# Patient Record
Sex: Female | Born: 1950 | Race: Black or African American | Hispanic: No | Smoking: Current every day smoker
Health system: Southern US, Community
[De-identification: ages and names within clinical notes are randomized; demographics above are authoritative.]

## PROBLEM LIST (undated history)

## (undated) DIAGNOSIS — E119 Type 2 diabetes mellitus without complications: Secondary | ICD-10-CM

## (undated) DIAGNOSIS — C801 Malignant (primary) neoplasm, unspecified: Secondary | ICD-10-CM

## (undated) DIAGNOSIS — I10 Essential (primary) hypertension: Secondary | ICD-10-CM

## (undated) DIAGNOSIS — I739 Peripheral vascular disease, unspecified: Secondary | ICD-10-CM

## (undated) DIAGNOSIS — E785 Hyperlipidemia, unspecified: Secondary | ICD-10-CM

## (undated) HISTORY — DX: Malignant (primary) neoplasm, unspecified: C80.1

## (undated) HISTORY — DX: Type 2 diabetes mellitus without complications: E11.9

## (undated) HISTORY — DX: Hyperlipidemia, unspecified: E78.5

## (undated) HISTORY — DX: Essential (primary) hypertension: I10

## (undated) HISTORY — PX: ABDOMINAL HYSTERECTOMY: SHX81

## (undated) HISTORY — PX: OTHER SURGICAL HISTORY: SHX169

---

## 2015-08-14 ENCOUNTER — Ambulatory Visit (INDEPENDENT_AMBULATORY_CARE_PROVIDER_SITE_OTHER): Payer: Medicare Other | Admitting: Vascular Surgery

## 2015-08-14 ENCOUNTER — Other Ambulatory Visit: Payer: Self-pay

## 2015-08-14 ENCOUNTER — Encounter: Payer: Self-pay | Admitting: Vascular Surgery

## 2015-08-14 VITALS — BP 133/47 | HR 80 | Temp 98.1°F | Resp 16 | Ht 61.0 in | Wt 197.0 lb

## 2015-08-14 DIAGNOSIS — I70269 Atherosclerosis of native arteries of extremities with gangrene, unspecified extremity: Secondary | ICD-10-CM | POA: Insufficient documentation

## 2015-08-14 DIAGNOSIS — I70261 Atherosclerosis of native arteries of extremities with gangrene, right leg: Secondary | ICD-10-CM

## 2015-08-14 NOTE — Progress Notes (Signed)
Referred by:  Traci Raymond, [place] [address]  Reason for referral: Right 2nd toe distal phalange ulcer  History of Present Illness  Traci Raymond is a 65 y.o. (22-Jul-1951) female history of stents in both legs who presents with chief complaint: pain in right leg.  Onset of short distance intermittent claudication for years, with worsening of right 2nd toe wound 3 weeks ago.  The patient is being seen by wound care.  With debridement of the ulcer overlying the right second toe, bone became exposed.  The patient is using a combination of silvadene and Betadine paint to the right 2nd toe at this point.  Pain is described as aching, severity 3-6/10, and associated with walking.  Patient has attempted to treat this pain with wound care.  The patient has no rest pain symptoms.  Atherosclerotic risk factors include: HTN, DM, HLD, and active smoking.  Past Medical History  Diagnosis Date  . Cancer ([place])     Uterian  . Diabetes mellitus without complication ([place])   . Hypertension   . Hyperlipidemia     Controlled by Rx    Past Surgical History  Procedure Laterality Date  . Abdominal hysterectomy    . Stents Bilateral     LE Right Leg one stent and Left has 2 stents    Social History   Social History  . Marital Status: Widowed    Spouse Name: N/A  . Number of Children: N/A  . Years of Education: N/A   Occupational History  . Not on file.   Social History Main Topics  . Smoking status: Current Every Day Smoker  . Smokeless tobacco: Never Used  . Alcohol Use: No  . Drug Use: Yes  . Sexual Activity: Not on file   Other Topics Concern  . Not on file   Social History Narrative  . No narrative on file    Family History  Problem Relation Age of Onset  . Diabetes Mother   . Hypertension Mother   . Hyperlipidemia Mother   . Diabetes Father   . Hyperlipidemia Father   . Hypertension Father     Current Outpatient Prescriptions  Medication Sig Dispense  Refill  . clindamycin (CLEOCIN) 300 MG capsule     . clopidogrel (PLAVIX) 75 MG tablet     . fluconazole (DIFLUCAN) 200 MG tablet     . GLOBAL EASE INJECT PEN NEEDLES 29G X 12MM MISC     . LEVEMIR FLEXTOUCH 100 UNIT/ML Pen     . lisinopril (PRINIVIL,ZESTRIL) 10 MG tablet     . lisinopril (PRINIVIL,ZESTRIL) 5 MG tablet     . metFORMIN (GLUCOPHAGE) 1000 MG tablet     . NOVOLOG FLEXPEN 100 UNIT/ML FlexPen     . potassium chloride (K-DUR) 10 MEQ tablet     . rosuvastatin (CRESTOR) 40 MG tablet     . silver sulfADIAZINE (SILVADENE) 1 % cream      No current facility-administered medications for this visit.    Not on File   REVIEW OF SYSTEMS:  (Positives checked otherwise negative)  CARDIOVASCULAR:   [ ]  chest pain,  [ ]  chest pressure,  [ ]  palpitations,  [ ]  shortness of breath when laying flat,  [ ]  shortness of breath with exertion,   [x]  pain in feet when walking,  [ ]  pain in feet when laying flat, [ ]  history of blood clot in veins (DVT),  [ ]  history of phlebitis,  [ ]  swelling in  legs,  [ ]  varicose veins  PULMONARY:   [ ]  productive cough,  [ ]  asthma,  [ ]  wheezing  NEUROLOGIC:   [ ]  weakness in arms or legs,  [ ]  numbness in arms or legs,  [ ]  difficulty speaking or slurred speech,  [ ]  temporary loss of vision in one eye,  [ ]  dizziness  HEMATOLOGIC:   [ ]  bleeding problems,  [ ]  problems with blood clotting too easily  MUSCULOSKEL:   [ ]  joint pain, [ ]  joint swelling  GASTROINTEST:   [ ]  vomiting blood,  [ ]  blood in stool     GENITOURINARY:   [ ]  burning with urination,  [ ]  blood in urine  PSYCHIATRIC:   [ ]  history of major depression  INTEGUMENTARY:   [ ]  rashes,  [x]  ulcers  CONSTITUTIONAL:   [ ]  fever,  [ ]  chills   For VQI Use Only  PRE-ADM LIVING: Home  AMB STATUS: Ambulatory  CAD Sx: None  PRIOR CHF: None  STRESS TEST: [x]  No, [ ]  Normal, [ ]  + ischemia, [ ]  + MI, [ ]  Both   Physical Examination  Filed Vitals:    08/14/15 1004 08/14/15 1014  BP: 153/46 133/47  Pulse: 79 80  Temp: 98.1 F (36.7 C)   TempSrc: Oral   Resp: 16   Height: 5\' 1"  (1.549 m)   Weight: 197 lb (89.359 kg)   SpO2: 97%    Body mass index is 37.24 kg/(m^2).  General: A&O x 3, WD, obese  Head: [place]/AT  Ear/Nose/Throat: Hearing grossly intact, nares w/o erythema or drainage, oropharynx w/o Erythema/Exudate, Mallampati score: 3  Eyes: PERRLA, EOMI  Neck: Supple, no nuchal rigidity, no palpable LAD  Pulmonary: Sym exp, good air movt, CTAB, no rales, rhonchi, & wheezing  Cardiac: RRR, Nl S1, S2, no Murmurs, rubs or gallops  Vascular: Vessel Right Left  Radial Palpable Palpable  Brachial Palpable Palpable  Carotid Palpable, without bruit Palpable, without bruit  Aorta Not palpable N/A  Femoral Palpable Palpable  Popliteal Not palpable Not palpable  PT Not Palpable Not Palpable  DP Not Palpable Not Palpable   Gastrointestinal: soft, NTND, -G/R, - HSM, - masses, - CVAT B  Musculoskeletal: M/S 5/5 throughout including full DF/PF in both feet, Extremities without ischemic changes except Right 2nd toe ischemic with exposed distal phalange bone  Neurologic: CN 2-12 intact , Pain and light touch intact in extremities including R foot, Motor exam as listed above  Psychiatric: Judgment intact, Mood & affect appropriate for pt's clinical situation  Dermatologic: See M/S exam for extremity exam, no rashes otherwise noted  Lymph : No Cervical, Axillary, or Inguinal lymphadenopathy    Non-Invasive Vascular Imaging  Outside physiologic BLE (Date: 08/13/15)  R: 0.49  L: 0.66  Outside Studies/Documentation 5 pages of outside documents were reviewed including: outside wound care chart and outside physiologic study.   Medical Decision Making  Traci Raymond is a 65 y.o. female who presents with: RLE critical limb ischemia, LLE intermittent claudication    I discussed with the patient the natural history of  critical limb ischemia: 25% require amputation in one year, 50% are able to maintain their limbs in one year, and 25-30% die in one year due to comorbidities.  Given the limb threatening status of this patient, I recommend an aggressive work up including proceeding with an: Aortogram, Bilateral runoff and possible intervention right leg I discussed with the patient the nature of angiographic procedures,  especially the limited patencies of any endovascular intervention. The patient is aware of that the risks of an angiographic procedure include but are not limited to: bleeding, infection, access site complications, embolization, rupture of treated vessel, dissection, possible need for emergent surgical intervention, and possible need for surgical procedures to treat the patient's pathology. The patient is aware of the risks and agrees to proceed.  The procedure is scheduled for: 9 JAN 17.  I discussed in depth with the patient the nature of atherosclerosis, and emphasized the importance of maximal medical management including strict control of blood pressure, blood glucose, and lipid levels, antiplatelet agents, obtaining regular exercise, and cessation of smoking.  The patient is aware that without maximal medical management the underlying atherosclerotic disease process will progress, limiting the benefit of any interventions. The patient is currently on a statin:  Crestor. The patient is currently on an anti-platelet: Plavix.  Thank you for allowing [place] to participate in this patient's care.   Adele Barthel, MD [facility] Office: [phone] Pager: [phone]  08/14/2015, 10:36 AM

## 2015-08-27 ENCOUNTER — Other Ambulatory Visit: Payer: Self-pay | Admitting: *Deleted

## 2015-08-27 ENCOUNTER — Ambulatory Visit (HOSPITAL_COMMUNITY)
Admission: RE | Admit: 2015-08-27 | Discharge: 2015-08-27 | Disposition: A | Discharge status: Home / Self Care | Payer: Medicare Other | Source: Ambulatory Visit | Attending: Vascular Surgery | Admitting: Vascular Surgery

## 2015-08-27 ENCOUNTER — Encounter (HOSPITAL_COMMUNITY)
Admission: RE | Disposition: A | Discharge status: Home / Self Care | Payer: Self-pay | Source: Ambulatory Visit | Attending: Vascular Surgery

## 2015-08-27 DIAGNOSIS — Z7902 Long term (current) use of antithrombotics/antiplatelets: Secondary | ICD-10-CM | POA: Insufficient documentation

## 2015-08-27 DIAGNOSIS — I70335 Atherosclerosis of unspecified type of bypass graft(s) of the right leg with ulceration of other part of foot: Secondary | ICD-10-CM | POA: Diagnosis not present

## 2015-08-27 DIAGNOSIS — F17210 Nicotine dependence, cigarettes, uncomplicated: Secondary | ICD-10-CM | POA: Diagnosis not present

## 2015-08-27 DIAGNOSIS — E785 Hyperlipidemia, unspecified: Secondary | ICD-10-CM | POA: Diagnosis not present

## 2015-08-27 DIAGNOSIS — Z6837 Body mass index (BMI) 37.0-37.9, adult: Secondary | ICD-10-CM | POA: Insufficient documentation

## 2015-08-27 DIAGNOSIS — Z794 Long term (current) use of insulin: Secondary | ICD-10-CM | POA: Insufficient documentation

## 2015-08-27 DIAGNOSIS — E1151 Type 2 diabetes mellitus with diabetic peripheral angiopathy without gangrene: Secondary | ICD-10-CM | POA: Diagnosis not present

## 2015-08-27 DIAGNOSIS — L97529 Non-pressure chronic ulcer of other part of left foot with unspecified severity: Secondary | ICD-10-CM | POA: Diagnosis not present

## 2015-08-27 DIAGNOSIS — E669 Obesity, unspecified: Secondary | ICD-10-CM | POA: Insufficient documentation

## 2015-08-27 DIAGNOSIS — Z8249 Family history of ischemic heart disease and other diseases of the circulatory system: Secondary | ICD-10-CM | POA: Diagnosis not present

## 2015-08-27 DIAGNOSIS — Z7984 Long term (current) use of oral hypoglycemic drugs: Secondary | ICD-10-CM | POA: Insufficient documentation

## 2015-08-27 DIAGNOSIS — I70221 Atherosclerosis of native arteries of extremities with rest pain, right leg: Secondary | ICD-10-CM | POA: Diagnosis not present

## 2015-08-27 DIAGNOSIS — I70261 Atherosclerosis of native arteries of extremities with gangrene, right leg: Secondary | ICD-10-CM | POA: Insufficient documentation

## 2015-08-27 DIAGNOSIS — Z95820 Peripheral vascular angioplasty status with implants and grafts: Secondary | ICD-10-CM | POA: Diagnosis not present

## 2015-08-27 DIAGNOSIS — I10 Essential (primary) hypertension: Secondary | ICD-10-CM | POA: Insufficient documentation

## 2015-08-27 DIAGNOSIS — Z01810 Encounter for preprocedural cardiovascular examination: Secondary | ICD-10-CM

## 2015-08-27 DIAGNOSIS — I7025 Atherosclerosis of native arteries of other extremities with ulceration: Secondary | ICD-10-CM | POA: Diagnosis present

## 2015-08-27 DIAGNOSIS — I739 Peripheral vascular disease, unspecified: Secondary | ICD-10-CM

## 2015-08-27 DIAGNOSIS — I70269 Atherosclerosis of native arteries of extremities with gangrene, unspecified extremity: Secondary | ICD-10-CM | POA: Diagnosis present

## 2015-08-27 HISTORY — PX: PERIPHERAL VASCULAR CATHETERIZATION: SHX172C

## 2015-08-27 LAB — POCT I-STAT, CHEM 8
BUN: 10 mg/dL (ref 6–20)
CALCIUM ION: 1.16 mmol/L (ref 1.13–1.30)
Chloride: 103 mmol/L (ref 101–111)
Creatinine, Ser: 0.9 mg/dL (ref 0.44–1.00)
Glucose, Bld: 245 mg/dL — ABNORMAL HIGH (ref 65–99)
HCT: 40 % (ref 36.0–46.0)
HEMOGLOBIN: 13.6 g/dL (ref 12.0–15.0)
Potassium: 3.5 mmol/L (ref 3.5–5.1)
SODIUM: 141 mmol/L (ref 135–145)
TCO2: 25 mmol/L (ref 0–100)

## 2015-08-27 LAB — GLUCOSE, CAPILLARY
Glucose-Capillary: 217 mg/dL — ABNORMAL HIGH (ref 65–99)
Glucose-Capillary: 204 mg/dL — ABNORMAL HIGH (ref 65–99)

## 2015-08-27 SURGERY — ABDOMINAL AORTOGRAM
Anesthesia: LOCAL

## 2015-08-27 MED ORDER — OXYCODONE-ACETAMINOPHEN 5-325 MG PO TABS: 1.0000 | ORAL_TABLET | Freq: Four times a day (QID) | ORAL | Status: DC | PRN

## 2015-08-27 MED ORDER — HYDRALAZINE HCL 20 MG/ML IJ SOLN
INTRAMUSCULAR | Status: AC
  Filled 2015-08-27: qty 1

## 2015-08-27 MED ORDER — FENTANYL CITRATE (PF) 100 MCG/2ML IJ SOLN
INTRAMUSCULAR | Status: AC
  Filled 2015-08-27: qty 2

## 2015-08-27 MED ORDER — LIDOCAINE HCL (PF) 1 % IJ SOLN
INTRAMUSCULAR | Status: DC | PRN
  Administered 2015-08-27: 14 mL

## 2015-08-27 MED ORDER — FENTANYL CITRATE (PF) 100 MCG/2ML IJ SOLN
INTRAMUSCULAR | Status: DC | PRN
  Administered 2015-08-27: 50 ug via INTRAVENOUS

## 2015-08-27 MED ORDER — SODIUM CHLORIDE 0.9 % IV SOLN
INTRAVENOUS | Status: DC
  Administered 2015-08-27: 08:00:00 via INTRAVENOUS

## 2015-08-27 MED ORDER — MIDAZOLAM HCL 2 MG/2ML IJ SOLN
INTRAMUSCULAR | Status: DC | PRN
  Administered 2015-08-27: 1 mg via INTRAVENOUS

## 2015-08-27 MED ORDER — HYDRALAZINE HCL 20 MG/ML IJ SOLN
10.0000 mg | INTRAMUSCULAR | Status: DC | PRN
Start: 2015-08-27 — End: 2015-08-27

## 2015-08-27 MED ORDER — HEPARIN (PORCINE) IN NACL 2-0.9 UNIT/ML-% IJ SOLN
INTRAMUSCULAR | Status: AC
  Filled 2015-08-27: qty 1000

## 2015-08-27 MED ORDER — HYDRALAZINE HCL 20 MG/ML IJ SOLN
INTRAMUSCULAR | Status: DC | PRN
  Administered 2015-08-27 (×2): 10 mg via INTRAVENOUS

## 2015-08-27 MED ORDER — MIDAZOLAM HCL 2 MG/2ML IJ SOLN
INTRAMUSCULAR | Status: AC
  Filled 2015-08-27: qty 2

## 2015-08-27 MED ORDER — SODIUM CHLORIDE 0.9 % IV SOLN: 1.0000 mL/kg/h | INTRAVENOUS | Status: DC

## 2015-08-27 MED ORDER — NITROGLYCERIN 1 MG/10 ML FOR IR/CATH LAB
INTRA_ARTERIAL | Status: AC
  Filled 2015-08-27: qty 10

## 2015-08-27 MED ORDER — LIDOCAINE HCL (PF) 1 % IJ SOLN
INTRAMUSCULAR | Status: AC
  Filled 2015-08-27: qty 30

## 2015-08-27 MED ORDER — MORPHINE SULFATE (PF) 2 MG/ML IV SOLN: 2.0000 mg | INTRAVENOUS | Status: DC | PRN

## 2015-08-27 SURGICAL SUPPLY — 11 items
CATH OMNI FLUSH 5F 65CM (CATHETERS) ×2 IMPLANT
CATH STRAIGHT 5FR 65CM (CATHETERS) ×2 IMPLANT
COVER PRB 48X5XTLSCP FOLD TPE (BAG) ×1 IMPLANT
COVER PROBE 5X48 (BAG) ×1
KIT MICROINTRODUCER STIFF 5F (SHEATH) ×2 IMPLANT
KIT PV (KITS) ×2 IMPLANT
SHEATH PINNACLE 5F 10CM (SHEATH) ×2 IMPLANT
SYR MEDRAD MARK V 150ML (SYRINGE) ×2 IMPLANT
TRANSDUCER W/STOPCOCK (MISCELLANEOUS) ×2 IMPLANT
TRAY PV CATH (CUSTOM PROCEDURE TRAY) ×2 IMPLANT
WIRE BENTSON .035X145CM (WIRE) ×2 IMPLANT

## 2015-08-27 NOTE — H&P (View-Only) (Signed)
Referred by:  Caprice Beaver, [place] [address]  Reason for referral: Right 2nd toe distal phalange ulcer  History of Present Illness  Traci Raymond is a 65 y.o. (1951-02-15) female history of stents in both legs who presents with chief complaint: pain in right leg.  Onset of short distance intermittent claudication for years, with worsening of right 2nd toe wound 3 weeks ago.  The patient is being seen by wound care.  With debridement of the ulcer overlying the right second toe, bone became exposed.  The patient is using a combination of silvadene and Betadine paint to the right 2nd toe at this point.  Pain is described as aching, severity 3-6/10, and associated with walking.  Patient has attempted to treat this pain with wound care.  The patient has no rest pain symptoms.  Atherosclerotic risk factors include: HTN, DM, HLD, and active smoking.  Past Medical History  Diagnosis Date  . Cancer ([place])     Uterian  . Diabetes mellitus without complication ([place])   . Hypertension   . Hyperlipidemia     Controlled by Rx    Past Surgical History  Procedure Laterality Date  . Abdominal hysterectomy    . Stents Bilateral     LE Right Leg one stent and Left has 2 stents    Social History   Social History  . Marital Status: Widowed    Spouse Name: N/A  . Number of Children: N/A  . Years of Education: N/A   Occupational History  . Not on file.   Social History Main Topics  . Smoking status: Current Every Day Smoker  . Smokeless tobacco: Never Used  . Alcohol Use: No  . Drug Use: Yes  . Sexual Activity: Not on file   Other Topics Concern  . Not on file   Social History Narrative  . No narrative on file    Family History  Problem Relation Age of Onset  . Diabetes Mother   . Hypertension Mother   . Hyperlipidemia Mother   . Diabetes Father   . Hyperlipidemia Father   . Hypertension Father     Current Outpatient Prescriptions  Medication Sig Dispense  Refill  . clindamycin (CLEOCIN) 300 MG capsule     . clopidogrel (PLAVIX) 75 MG tablet     . fluconazole (DIFLUCAN) 200 MG tablet     . GLOBAL EASE INJECT PEN NEEDLES 29G X 12MM MISC     . LEVEMIR FLEXTOUCH 100 UNIT/ML Pen     . lisinopril (PRINIVIL,ZESTRIL) 10 MG tablet     . lisinopril (PRINIVIL,ZESTRIL) 5 MG tablet     . metFORMIN (GLUCOPHAGE) 1000 MG tablet     . NOVOLOG FLEXPEN 100 UNIT/ML FlexPen     . potassium chloride (K-DUR) 10 MEQ tablet     . rosuvastatin (CRESTOR) 40 MG tablet     . silver sulfADIAZINE (SILVADENE) 1 % cream      No current facility-administered medications for this visit.    Not on File   REVIEW OF SYSTEMS:  (Positives checked otherwise negative)  CARDIOVASCULAR:   [ ]  chest pain,  [ ]  chest pressure,  [ ]  palpitations,  [ ]  shortness of breath when laying flat,  [ ]  shortness of breath with exertion,   [x]  pain in feet when walking,  [ ]  pain in feet when laying flat, [ ]  history of blood clot in veins (DVT),  [ ]  history of phlebitis,  [ ]  swelling in  legs,  [ ]  varicose veins  PULMONARY:   [ ]  productive cough,  [ ]  asthma,  [ ]  wheezing  NEUROLOGIC:   [ ]  weakness in arms or legs,  [ ]  numbness in arms or legs,  [ ]  difficulty speaking or slurred speech,  [ ]  temporary loss of vision in one eye,  [ ]  dizziness  HEMATOLOGIC:   [ ]  bleeding problems,  [ ]  problems with blood clotting too easily  MUSCULOSKEL:   [ ]  joint pain, [ ]  joint swelling  GASTROINTEST:   [ ]  vomiting blood,  [ ]  blood in stool     GENITOURINARY:   [ ]  burning with urination,  [ ]  blood in urine  PSYCHIATRIC:   [ ]  history of major depression  INTEGUMENTARY:   [ ]  rashes,  [x]  ulcers  CONSTITUTIONAL:   [ ]  fever,  [ ]  chills   For VQI Use Only  PRE-ADM LIVING: Home  AMB STATUS: Ambulatory  CAD Sx: None  PRIOR CHF: None  STRESS TEST: [x]  No, [ ]  Normal, [ ]  + ischemia, [ ]  + MI, [ ]  Both   Physical Examination  Filed Vitals:    08/14/15 1004 08/14/15 1014  BP: 153/46 133/47  Pulse: 79 80  Temp: 98.1 F (36.7 C)   TempSrc: Oral   Resp: 16   Height: 5\' 1"  (1.549 m)   Weight: 197 lb (89.359 kg)   SpO2: 97%    Body mass index is 37.24 kg/(m^2).  General: A&O x 3, WD, obese  Head: [place]/AT  Ear/Nose/Throat: Hearing grossly intact, nares w/o erythema or drainage, oropharynx w/o Erythema/Exudate, Mallampati score: 3  Eyes: PERRLA, EOMI  Neck: Supple, no nuchal rigidity, no palpable LAD  Pulmonary: Sym exp, good air movt, CTAB, no rales, rhonchi, & wheezing  Cardiac: RRR, Nl S1, S2, no Murmurs, rubs or gallops  Vascular: Vessel Right Left  Radial Palpable Palpable  Brachial Palpable Palpable  Carotid Palpable, without bruit Palpable, without bruit  Aorta Not palpable N/A  Femoral Palpable Palpable  Popliteal Not palpable Not palpable  PT Not Palpable Not Palpable  DP Not Palpable Not Palpable   Gastrointestinal: soft, NTND, -G/R, - HSM, - masses, - CVAT B  Musculoskeletal: M/S 5/5 throughout including full DF/PF in both feet, Extremities without ischemic changes except Right 2nd toe ischemic with exposed distal phalange bone  Neurologic: CN 2-12 intact , Pain and light touch intact in extremities including R foot, Motor exam as listed above  Psychiatric: Judgment intact, Mood & affect appropriate for pt's clinical situation  Dermatologic: See M/S exam for extremity exam, no rashes otherwise noted  Lymph : No Cervical, Axillary, or Inguinal lymphadenopathy    Non-Invasive Vascular Imaging  Outside physiologic BLE (Date: 08/13/15)  R: 0.49  L: 0.66  Outside Studies/Documentation 5 pages of outside documents were reviewed including: outside wound care chart and outside physiologic study.   Medical Decision Making  Traci Raymond is a 65 y.o. female who presents with: RLE critical limb ischemia, LLE intermittent claudication    I discussed with the patient the natural history of  critical limb ischemia: 25% require amputation in one year, 50% are able to maintain their limbs in one year, and 25-30% die in one year due to comorbidities.  Given the limb threatening status of this patient, I recommend an aggressive work up including proceeding with an: Aortogram, Bilateral runoff and possible intervention right leg I discussed with the patient the nature of angiographic procedures,  especially the limited patencies of any endovascular intervention. The patient is aware of that the risks of an angiographic procedure include but are not limited to: bleeding, infection, access site complications, embolization, rupture of treated vessel, dissection, possible need for emergent surgical intervention, and possible need for surgical procedures to treat the patient's pathology. The patient is aware of the risks and agrees to proceed.  The procedure is scheduled for: 9 JAN 17.  I discussed in depth with the patient the nature of atherosclerosis, and emphasized the importance of maximal medical management including strict control of blood pressure, blood glucose, and lipid levels, antiplatelet agents, obtaining regular exercise, and cessation of smoking.  The patient is aware that without maximal medical management the underlying atherosclerotic disease process will progress, limiting the benefit of any interventions. The patient is currently on a statin:  Crestor. The patient is currently on an anti-platelet: Plavix.  Thank you for allowing [place] to participate in this patient's care.   Adele Barthel, MD [facility] Office: [phone] Pager: [phone]  08/14/2015, 10:36 AM

## 2015-08-27 NOTE — Discharge Instructions (Signed)
°  HOLD METFORMIN FOR 48 HOURS- RESUME SUN. MORNING.   Angiogram, Care After Refer to this sheet in the next few weeks. These instructions provide you with information about caring for yourself after your procedure. Your health care provider may also give you more specific instructions. Your treatment has been planned according to current medical practices, but problems sometimes occur. Call your health care provider if you have any problems or questions after your procedure. WHAT TO EXPECT AFTER THE PROCEDURE After your procedure, it is typical to have the following:  Bruising at the catheter insertion site that usually fades within 1-2 weeks.  Blood collecting in the tissue (hematoma) that may be painful to the touch. It should usually decrease in size and tenderness within 1-2 weeks. HOME CARE INSTRUCTIONS  Take medicines only as directed by your health care provider.  You may shower 24-48 hours after the procedure or as directed by your health care provider. Remove the bandage (dressing) and gently wash the site with plain soap and water. Pat the area dry with a clean towel. Do not rub the site, because this may cause bleeding.  Do not take baths, swim, or use a hot tub until your health care provider approves.  Check your insertion site every day for redness, swelling, or drainage.  Do not apply powder or lotion to the site.  Do not lift over 10 lb (4.5 kg) for 5 days after your procedure or as directed by your health care provider.  Ask your health care provider when it is okay to:  Return to work or school.  Resume usual physical activities or sports.  Resume sexual activity.  Do not drive home if you are discharged the same day as the procedure. Have someone else drive you.  You may drive 24 hours after the procedure unless otherwise instructed by your health care provider.  Do not operate machinery or power tools for 24 hours after the procedure or as directed by your  health care provider.  If your procedure was done as an outpatient procedure, which means that you went home the same day as your procedure, a responsible adult should be with you for the first 24 hours after you arrive home.  Keep all follow-up visits as directed by your health care provider. This is important. SEEK MEDICAL CARE IF:  You have a fever.  You have chills.  You have increased bleeding from the catheter insertion site. Hold pressure on the site. SEEK IMMEDIATE MEDICAL CARE IF:  You have unusual pain at the catheter insertion site.  You have redness, warmth, or swelling at the catheter insertion site.  You have drainage (other than a small amount of blood on the dressing) from the catheter insertion site.  The catheter insertion site is bleeding, and the bleeding does not stop after 30 minutes of holding steady pressure on the site.  The area near or just beyond the catheter insertion site becomes pale, cool, tingly, or numb.   This information is not intended to replace advice given to you by your health care provider. Make sure you discuss any questions you have with your health care provider.   Document Released: 02/10/2005 Document Revised: 08/15/2014 Document Reviewed: 12/26/2012 Elsevier Interactive Patient Education [employer].

## 2015-08-27 NOTE — Interval H&P Note (Signed)
[facility]  History and Physical Update  The patient was interviewed and re-examined.  The patient's previous History and Physical has been reviewed and is unchanged from my consult.  There is no change in the plan of care: aortogram, bilateral leg runoff, and possible right leg intervention.  I discussed with the patient the nature of angiographic procedures, especially the limited patencies of any endovascular intervention.  The patient is aware of that the risks of an angiographic procedure include but are not limited to: bleeding, infection, access site complications, renal failure, embolization, rupture of vessel, dissection, possible need for emergent surgical intervention, possible need for surgical procedures to treat the patient's pathology, anaphylactic reaction to contrast, and stroke and death.  The patient is aware of the risks and agrees to proceed.   Adele Barthel, MD [facility] Office: [phone] Pager: [phone]  08/27/2015, 7:16 AM

## 2015-08-27 NOTE — Progress Notes (Signed)
Site area: lt groin fa sheath  Site Prior to Removal:  Level  0 Pressure Applied For:  20 minutes Manual:   yes Patient Status During Pull:  stable Post Pull Site:  Level  0 Post Pull Instructions Given:  yes Post Pull Pulses Present: yes Dressing Applied:  tegaderm Bedrest begins @  [id number] Comments:

## 2015-08-28 ENCOUNTER — Encounter (HOSPITAL_COMMUNITY): Payer: Self-pay | Admitting: Vascular Surgery

## 2015-08-28 ENCOUNTER — Telehealth: Payer: Self-pay | Admitting: Vascular Surgery

## 2015-08-28 NOTE — Telephone Encounter (Signed)
-----   Message from Mena Goes, RN sent at 08/27/2015 10:47 AM EST ----- Regarding: schedule   ----- Message -----    From: Conrad Vado, MD    Sent: 08/27/2015   9:35 AM      To: [facility] [address]  Clinton Pleas [id number] 1951/07/29  Procedure:  1.  Left CFA cannulation with ultrasound 2.  Placement of catheter in aorta 3.  Aortogram 4.  Conscious sedation 5.  Right leg runoff via catheter 6.  Left leg runoff via sheath  Follow-up: 1 week  Orders(s) for follow-up: BLE GSV mapping

## 2015-08-28 NOTE — Telephone Encounter (Signed)
LM for pt re appt, dpm °

## 2015-09-01 ENCOUNTER — Ambulatory Visit (HOSPITAL_COMMUNITY)
Admission: RE | Admit: 2015-09-01 | Discharge: 2015-09-01 | Disposition: A | Discharge status: Home / Self Care | Payer: Medicare Other | Source: Ambulatory Visit | Attending: Vascular Surgery | Admitting: Vascular Surgery

## 2015-09-01 DIAGNOSIS — Z01810 Encounter for preprocedural cardiovascular examination: Secondary | ICD-10-CM | POA: Insufficient documentation

## 2015-09-01 DIAGNOSIS — E119 Type 2 diabetes mellitus without complications: Secondary | ICD-10-CM | POA: Insufficient documentation

## 2015-09-01 DIAGNOSIS — I10 Essential (primary) hypertension: Secondary | ICD-10-CM | POA: Diagnosis not present

## 2015-09-01 DIAGNOSIS — I739 Peripheral vascular disease, unspecified: Secondary | ICD-10-CM

## 2015-09-01 DIAGNOSIS — E785 Hyperlipidemia, unspecified: Secondary | ICD-10-CM | POA: Insufficient documentation

## 2015-09-02 ENCOUNTER — Encounter: Payer: Self-pay | Admitting: Vascular Surgery

## 2015-09-04 ENCOUNTER — Ambulatory Visit (INDEPENDENT_AMBULATORY_CARE_PROVIDER_SITE_OTHER): Payer: Medicare Other | Admitting: Vascular Surgery

## 2015-09-04 ENCOUNTER — Other Ambulatory Visit: Payer: Self-pay

## 2015-09-04 ENCOUNTER — Encounter: Payer: Self-pay | Admitting: Vascular Surgery

## 2015-09-04 VITALS — BP 170/68 | HR 88 | Temp 97.2°F | Resp 18 | Ht 61.0 in | Wt 193.0 lb

## 2015-09-04 DIAGNOSIS — I70261 Atherosclerosis of native arteries of extremities with gangrene, right leg: Secondary | ICD-10-CM

## 2015-09-04 NOTE — Progress Notes (Signed)
Established Critical Limb Ischemia Patient  History of Present Illness  Traci Raymond is a 65 y.o. (01-Feb-1951) female who presents with chief complaint: R 2nd toe gangrene.  The patient has no rest pain and wounds include: R 2nd toe with exposed distal phalange.  The patient notes symptoms have mpt progressed.  The patient's treatment regimen currently included: maximal medical management.  Her recent angiogram demonstrated occluded bilateral SFA stents.  The patient has a R BK pop target.   Past Medical History  Diagnosis Date  . Cancer ([place])     Uterian  . Diabetes mellitus without complication ([place])   . Hypertension   . Hyperlipidemia     Controlled by Rx    Past Surgical History  Procedure Laterality Date  . Abdominal hysterectomy    . Stents Bilateral     LE Right Leg one stent and Left has 2 stents  . Peripheral vascular catheterization N/A 08/27/2015    Procedure: Abdominal Aortogram;  Surgeon: Conrad Anderson, MD;  Location: [facility];  Service: Cardiovascular;  Laterality: N/A;    Social History   Social History  . Marital Status: Widowed    Spouse Name: N/A  . Number of Children: N/A  . Years of Education: N/A   Occupational History  . Not on file.   Social History Main Topics  . Smoking status: Current Every Day Smoker -- 0.50 packs/day    Types: Cigarettes  . Smokeless tobacco: Never Used  . Alcohol Use: No  . Drug Use: Yes  . Sexual Activity: Not on file   Other Topics Concern  . Not on file   Social History Narrative    Family History  Problem Relation Age of Onset  . Diabetes Mother   . Hypertension Mother   . Hyperlipidemia Mother   . Diabetes Father   . Hyperlipidemia Father   . Hypertension Father     Current Outpatient Prescriptions  Medication Sig Dispense Refill  . aspirin EC 81 MG tablet Take 81 mg by mouth daily.    . cholecalciferol (VITAMIN D) 1000 units tablet Take 1,000 Units by mouth daily.    . clopidogrel  (PLAVIX) 75 MG tablet Take 75 mg by mouth daily.     . ferrous sulfate 325 (65 FE) MG tablet Take 325 mg by mouth daily with breakfast.    . LEVEMIR FLEXTOUCH 100 UNIT/ML Pen Inject 30 Units into the skin daily at 10 pm.     . lisinopril (PRINIVIL,ZESTRIL) 10 MG tablet Take 10 mg by mouth daily.     . metFORMIN (GLUCOPHAGE) 1000 MG tablet Take 1,000 mg by mouth 2 (two) times daily with a meal.     . NOVOLOG FLEXPEN 100 UNIT/ML FlexPen Inject 30 Units into the skin daily.     . Omega-3 Fatty Acids (OMEGA-3 FISH OIL) 1200 MG CAPS Take 1,200 mg by mouth daily.    . potassium chloride (K-DUR) 10 MEQ tablet Take 10 mEq by mouth daily.     . rosuvastatin (CRESTOR) 40 MG tablet Take 40 mg by mouth daily.     Marland Kitchen omega-3 acid ethyl esters (LOVAZA) 1 g capsule      No current facility-administered medications for this visit.     No Known Allergies   REVIEW OF SYSTEMS:  (Positives checked otherwise negative)  CARDIOVASCULAR:   [ ]  chest pain,  [ ]  chest pressure,  [ ]  palpitations,  [ ]  shortness of breath when laying flat,  [ ]   shortness of breath with exertion,   [x]  pain in feet when walking,  [x]  pain in feet when laying flat, [ ]  history of blood clot in veins (DVT),  [ ]  history of phlebitis,  [ ]  swelling in legs,  [ ]  varicose veins  PULMONARY:   [ ]  productive cough,  [ ]  asthma,  [ ]  wheezing  NEUROLOGIC:   [ ]  weakness in arms or legs,  [ ]  numbness in arms or legs,  [ ]  difficulty speaking or slurred speech,  [ ]  temporary loss of vision in one eye,  [ ]  dizziness  HEMATOLOGIC:   [ ]  bleeding problems,  [ ]  problems with blood clotting too easily  MUSCULOSKEL:   [ ]  joint pain, [ ]  joint swelling  GASTROINTEST:   [ ]  vomiting blood,  [ ]  blood in stool     GENITOURINARY:   [ ]  burning with urination,  [ ]  blood in urine  PSYCHIATRIC:   [ ]  history of major depression  INTEGUMENTARY:   [ ]  rashes,  [x]  ulcers  CONSTITUTIONAL:   [ ]  fever,  [ ]   chills   Physical Examination  Filed Vitals:   09/04/15 1448 09/04/15 1455  BP: 168/70 170/68  Pulse: 86 88  Temp: 97.2 F (36.2 C)   TempSrc: Oral   Resp: 18   Height: 5\' 1"  (1.549 m)   Weight: 193 lb (87.544 kg)   SpO2: 98%    Body mass index is 36.49 kg/(m^2).   General: A&O x 3, WD, obese  Pulmonary: Sym exp, good air movt, CTAB, no rales, rhonchi, & wheezing  Cardiac: RRR, Nl S1, S2, no Murmurs, rubs or gallops  Vascular: Vessel Right Left  Radial Palpable Palpable  Brachial Palpable Palpable  Carotid Palpable, without bruit Palpable, without bruit  Aorta Not palpable N/A  Femoral Palpable Palpable  Popliteal Not palpable Not palpable  PT Not Palpable Not Palpable  DP Not Palpable Not Palpable   Gastrointestinal: soft, NTND, -G/R, - HSM, - masses, - CVAT B  Musculoskeletal: M/S 5/5 throughout including full DF/PF in both feet, Extremities without ischemic changes except Right 2nd toe ischemic with exposed distal phalange bone, ischemic R 2nd toe  Neurologic: CN 2-12 intact , Pain and light touch intact in extremities including R foot, Motor exam as listed above  BLE GSV Mapping (09/01/15)  R: marginal vein  L: marginal but possibly acceptable   Medical Decision Making  Traci Raymond is a 65 y.o. female who presents with: RLE critical limb ischemia with exposed R 2nd distal phalange   Based on the patient's vascular studies and examination, I have offered the patient: R CFA to BK pop bypass with ips GSV vs Propaten  The risk, benefits, and alternative for bypass operations were discussed with the patient.  The patient is aware the risks include but are not limited to: bleeding, infection, myocardial infarction, stroke, limb loss, nerve damage, need for additional procedures in the future, wound complications, and inability to complete the bypass.    The patient is aware of these risks and agreed to proceed.  She is scheduled  for 1 FEB 17.  I discussed in depth with the patient the nature of atherosclerosis, and emphasized the importance of maximal medical management including strict control of blood pressure, blood glucose, and lipid levels, antiplatelet agents, obtaining regular exercise, and cessation of smoking.    The patient is aware that without maximal medical management the underlying atherosclerotic disease process  will progress, limiting the benefit of any interventions.  The patient is currently on a statin: Crestor.  The patient is currently on an anti-platelet: ASA, Plavix.  Thank you for allowing [place] to participate in this patient's care.   Adele Barthel, MD [facility] Office: [phone] Pager: [phone]  09/04/2015, 3:31 PM

## 2015-09-08 ENCOUNTER — Other Ambulatory Visit: Payer: Self-pay

## 2015-09-08 ENCOUNTER — Encounter (HOSPITAL_COMMUNITY): Payer: Self-pay | Admitting: *Deleted

## 2015-09-08 MED ORDER — CHLORHEXIDINE GLUCONATE CLOTH 2 % EX PADS: 6.0000 | MEDICATED_PAD | Freq: Once | CUTANEOUS | Status: DC

## 2015-09-08 MED ORDER — SODIUM CHLORIDE 0.9 % IV SOLN: INTRAVENOUS | Status: DC

## 2015-09-08 MED ORDER — DEXTROSE 5 % IV SOLN
1.5000 g | INTRAVENOUS | Status: AC
  Administered 2015-09-09: 1.5 g via INTRAVENOUS
  Filled 2015-09-08: qty 1.5

## 2015-09-08 NOTE — Progress Notes (Signed)
Pt denies SOB and chest pain but is under the care of Dr. Rosana Hoes, Cardiology. Pt denies having a cardiac cath and echo. Pt stated that she was instructed by MD to take half dose of Levemir Insulin tonight and no diabetic medications the morning of surgery " just Aspirin, Plavix and BP pill ( Lisinopril). " Pt advised to stop otc vitamins, fish oil,  Lovaza and NSAID's. Pt made aware to check BS the morning of procedure and instructed on diabetes protocol for BS<70 and >220. Pt verbalized understanding of all pre-op instructions.

## 2015-09-08 NOTE — Anesthesia Preprocedure Evaluation (Addendum)
Anesthesia Evaluation  Patient identified by MRN, date of birth, ID band Patient awake    Reviewed: Allergy & Precautions, NPO status , Patient's Chart, lab work & pertinent test results  Airway Mallampati: II  TM Distance: >3 FB Neck ROM: Full    Dental  (+) Poor Dentition, Missing, Dental Advisory Given   Pulmonary Current Smoker,    Pulmonary exam normal        Cardiovascular hypertension, Pt. on medications + Peripheral Vascular Disease  Normal cardiovascular exam     Neuro/Psych negative neurological ROS  negative psych ROS   GI/Hepatic negative GI ROS, Neg liver ROS,   Endo/Other  diabetes  Renal/GU negative Renal ROS     Musculoskeletal   Abdominal   Peds  Hematology   Anesthesia Other Findings   Reproductive/Obstetrics                            Anesthesia Physical Anesthesia Plan  ASA: III  Anesthesia Plan: General   Post-op Pain Management:    Induction:   Airway Management Planned: Oral ETT  Additional Equipment:   Intra-op Plan:   Post-operative Plan: Extubation in OR  Informed Consent: I have reviewed the patients History and Physical, chart, labs and discussed the procedure including the risks, benefits and alternatives for the proposed anesthesia with the patient or authorized representative who has indicated his/her understanding and acceptance.   Dental advisory given  Plan Discussed with: CRNA, Anesthesiologist and Surgeon  Anesthesia Plan Comments:        Anesthesia Quick Evaluation

## 2015-09-08 NOTE — Progress Notes (Signed)
According to Arbie Cookey, RN at Dr. Lianne Moris office, it is okay for pt to take Aspirin, Plavix and Lisinopril on morning of procedure.

## 2015-09-09 ENCOUNTER — Encounter (HOSPITAL_COMMUNITY): Payer: Self-pay | Admitting: *Deleted

## 2015-09-09 ENCOUNTER — Encounter (HOSPITAL_COMMUNITY)
Admission: RE | Disposition: A | Discharge status: Home Health | Payer: Self-pay | Source: Ambulatory Visit | Attending: Vascular Surgery

## 2015-09-09 ENCOUNTER — Inpatient Hospital Stay (HOSPITAL_COMMUNITY): Payer: Medicare Other | Admitting: Certified Registered Nurse Anesthetist

## 2015-09-09 ENCOUNTER — Inpatient Hospital Stay (HOSPITAL_COMMUNITY)
Admission: RE | Admit: 2015-09-09 | Discharge: 2015-09-12 | DRG: 253 | Disposition: A | Discharge status: Home Health | Payer: Medicare Other | Source: Ambulatory Visit | Attending: Vascular Surgery | Admitting: Vascular Surgery

## 2015-09-09 DIAGNOSIS — R509 Fever, unspecified: Secondary | ICD-10-CM

## 2015-09-09 DIAGNOSIS — Z7902 Long term (current) use of antithrombotics/antiplatelets: Secondary | ICD-10-CM | POA: Diagnosis not present

## 2015-09-09 DIAGNOSIS — E785 Hyperlipidemia, unspecified: Secondary | ICD-10-CM | POA: Diagnosis present

## 2015-09-09 DIAGNOSIS — Z7984 Long term (current) use of oral hypoglycemic drugs: Secondary | ICD-10-CM

## 2015-09-09 DIAGNOSIS — Z79899 Other long term (current) drug therapy: Secondary | ICD-10-CM | POA: Diagnosis not present

## 2015-09-09 DIAGNOSIS — Z833 Family history of diabetes mellitus: Secondary | ICD-10-CM

## 2015-09-09 DIAGNOSIS — F17210 Nicotine dependence, cigarettes, uncomplicated: Secondary | ICD-10-CM | POA: Diagnosis present

## 2015-09-09 DIAGNOSIS — I10 Essential (primary) hypertension: Secondary | ICD-10-CM | POA: Diagnosis present

## 2015-09-09 DIAGNOSIS — J9811 Atelectasis: Secondary | ICD-10-CM | POA: Diagnosis not present

## 2015-09-09 DIAGNOSIS — I70269 Atherosclerosis of native arteries of extremities with gangrene, unspecified extremity: Secondary | ICD-10-CM

## 2015-09-09 DIAGNOSIS — Z7982 Long term (current) use of aspirin: Secondary | ICD-10-CM | POA: Diagnosis not present

## 2015-09-09 DIAGNOSIS — Z419 Encounter for procedure for purposes other than remedying health state, unspecified: Secondary | ICD-10-CM

## 2015-09-09 DIAGNOSIS — I96 Gangrene, not elsewhere classified: Secondary | ICD-10-CM | POA: Diagnosis not present

## 2015-09-09 DIAGNOSIS — E1152 Type 2 diabetes mellitus with diabetic peripheral angiopathy with gangrene: Principal | ICD-10-CM | POA: Diagnosis present

## 2015-09-09 DIAGNOSIS — Z8249 Family history of ischemic heart disease and other diseases of the circulatory system: Secondary | ICD-10-CM | POA: Diagnosis not present

## 2015-09-09 DIAGNOSIS — R4182 Altered mental status, unspecified: Secondary | ICD-10-CM

## 2015-09-09 HISTORY — DX: Peripheral vascular disease, unspecified: I73.9

## 2015-09-09 HISTORY — PX: FEMORAL-POPLITEAL BYPASS GRAFT: SHX937

## 2015-09-09 HISTORY — PX: AMPUTATION TOE: SHX6595

## 2015-09-09 LAB — TYPE AND SCREEN
ABO/RH(D): A POS
ANTIBODY SCREEN: NEGATIVE

## 2015-09-09 LAB — PROTIME-INR
INR: 1.07 (ref 0.00–1.49)
Prothrombin Time: 14.1 seconds (ref 11.6–15.2)

## 2015-09-09 LAB — GLUCOSE, CAPILLARY
GLUCOSE-CAPILLARY: 235 mg/dL — AB (ref 65–99)
Glucose-Capillary: 224 mg/dL — ABNORMAL HIGH (ref 65–99)
Glucose-Capillary: 222 mg/dL — ABNORMAL HIGH (ref 65–99)
Glucose-Capillary: 256 mg/dL — ABNORMAL HIGH (ref 65–99)

## 2015-09-09 LAB — CBC
HCT: 39.9 % (ref 36.0–46.0)
HCT: 38.2 % (ref 36.0–46.0)
HEMOGLOBIN: 12.8 g/dL (ref 12.0–15.0)
Hemoglobin: 12.2 g/dL (ref 12.0–15.0)
MCH: 25.7 pg — AB (ref 26.0–34.0)
MCH: 25.5 pg — ABNORMAL LOW (ref 26.0–34.0)
MCHC: 32.1 g/dL (ref 30.0–36.0)
MCHC: 31.9 g/dL (ref 30.0–36.0)
MCV: 80 fL (ref 78.0–100.0)
MCV: 79.9 fL (ref 78.0–100.0)
PLATELETS: 237 10*3/uL (ref 150–400)
PLATELETS: 219 10*3/uL (ref 150–400)
RBC: 4.99 MIL/uL (ref 3.87–5.11)
RBC: 4.78 MIL/uL (ref 3.87–5.11)
RDW: 16.3 % — AB (ref 11.5–15.5)
RDW: 16.4 % — AB (ref 11.5–15.5)
WBC: 12.7 10*3/uL — ABNORMAL HIGH (ref 4.0–10.5)
WBC: 14.9 10*3/uL — AB (ref 4.0–10.5)

## 2015-09-09 LAB — COMPREHENSIVE METABOLIC PANEL
ALBUMIN: 3.5 g/dL (ref 3.5–5.0)
ALT: 10 U/L — ABNORMAL LOW (ref 14–54)
ANION GAP: 14 (ref 5–15)
AST: 12 U/L — ABNORMAL LOW (ref 15–41)
Alkaline Phosphatase: 107 U/L (ref 38–126)
BUN: 7 mg/dL (ref 6–20)
CALCIUM: 9.2 mg/dL (ref 8.9–10.3)
CHLORIDE: 102 mmol/L (ref 101–111)
CO2: 23 mmol/L (ref 22–32)
Creatinine, Ser: 1.18 mg/dL — ABNORMAL HIGH (ref 0.44–1.00)
GFR calc non Af Amer: 48 mL/min — ABNORMAL LOW (ref >60)
GFR, EST AFRICAN AMERICAN: 55 mL/min — AB (ref >60)
GLUCOSE: 247 mg/dL — AB (ref 65–99)
POTASSIUM: 3.6 mmol/L (ref 3.5–5.1)
SODIUM: 139 mmol/L (ref 135–145)
Total Bilirubin: 0.4 mg/dL (ref 0.3–1.2)
Total Protein: 7.1 g/dL (ref 6.5–8.1)

## 2015-09-09 LAB — ABO/RH: ABO/RH(D): A POS

## 2015-09-09 LAB — CREATININE, SERUM
CREATININE: 1.41 mg/dL — AB (ref 0.44–1.00)
GFR, EST AFRICAN AMERICAN: 45 mL/min — AB (ref >60)
GFR, EST NON AFRICAN AMERICAN: 38 mL/min — AB (ref >60)

## 2015-09-09 LAB — APTT: APTT: 29 s (ref 24–37)

## 2015-09-09 SURGERY — BYPASS GRAFT FEMORAL-POPLITEAL ARTERY
Anesthesia: General | Laterality: Right

## 2015-09-09 MED ORDER — PROTAMINE SULFATE 10 MG/ML IV SOLN
INTRAVENOUS | Status: AC
  Filled 2015-09-09: qty 5

## 2015-09-09 MED ORDER — SODIUM CHLORIDE 0.9 % IV SOLN
INTRAVENOUS | Status: DC | PRN
  Administered 2015-09-09: 09:00:00

## 2015-09-09 MED ORDER — OMEGA-3-ACID ETHYL ESTERS 1 G PO CAPS
1.0000 g | ORAL_CAPSULE | Freq: Every day | ORAL | Status: DC
  Administered 2015-09-10 – 2015-09-12 (×3): 1 g via ORAL
  Filled 2015-09-09 (×3): qty 1

## 2015-09-09 MED ORDER — SODIUM CHLORIDE 0.9 % IV SOLN: 500.0000 mL | Freq: Once | INTRAVENOUS | Status: DC | PRN

## 2015-09-09 MED ORDER — PHENOL 1.4 % MT LIQD: 1.0000 | OROMUCOSAL | Status: DC | PRN

## 2015-09-09 MED ORDER — LACTATED RINGERS IV SOLN
INTRAVENOUS | Status: DC | PRN
  Administered 2015-09-09 (×2): via INTRAVENOUS

## 2015-09-09 MED ORDER — ENOXAPARIN SODIUM 40 MG/0.4ML ~~LOC~~ SOLN
40.0000 mg | SUBCUTANEOUS | Status: DC
  Administered 2015-09-10 – 2015-09-12 (×3): 40 mg via SUBCUTANEOUS
  Filled 2015-09-09 (×3): qty 0.4

## 2015-09-09 MED ORDER — METFORMIN HCL 500 MG PO TABS
1000.0000 mg | ORAL_TABLET | Freq: Two times a day (BID) | ORAL | Status: DC
  Administered 2015-09-10 – 2015-09-12 (×5): 1000 mg via ORAL
  Filled 2015-09-09 (×5): qty 2

## 2015-09-09 MED ORDER — ALUM & MAG HYDROXIDE-SIMETH 200-200-20 MG/5ML PO SUSP: 15.0000 mL | ORAL | Status: DC | PRN

## 2015-09-09 MED ORDER — ONDANSETRON HCL 4 MG/2ML IJ SOLN: 4.0000 mg | Freq: Four times a day (QID) | INTRAMUSCULAR | Status: DC | PRN

## 2015-09-09 MED ORDER — DEXTROSE 5 % IV SOLN
1.5000 g | Freq: Two times a day (BID) | INTRAVENOUS | Status: AC
  Administered 2015-09-09 – 2015-09-10 (×2): 1.5 g via INTRAVENOUS
  Filled 2015-09-09 (×3): qty 1.5

## 2015-09-09 MED ORDER — ROCURONIUM BROMIDE 50 MG/5ML IV SOLN
INTRAVENOUS | Status: AC
  Filled 2015-09-09: qty 1

## 2015-09-09 MED ORDER — ONDANSETRON HCL 4 MG/2ML IJ SOLN
INTRAMUSCULAR | Status: AC
  Filled 2015-09-09: qty 2

## 2015-09-09 MED ORDER — LIDOCAINE HCL (CARDIAC) 20 MG/ML IV SOLN
INTRAVENOUS | Status: DC | PRN
  Administered 2015-09-09: 80 mg via INTRAVENOUS

## 2015-09-09 MED ORDER — MORPHINE SULFATE (PF) 2 MG/ML IV SOLN
INTRAVENOUS | Status: AC
  Filled 2015-09-09: qty 1

## 2015-09-09 MED ORDER — VECURONIUM BROMIDE 10 MG IV SOLR
INTRAVENOUS | Status: DC | PRN
  Administered 2015-09-09 (×3): 2 mg via INTRAVENOUS

## 2015-09-09 MED ORDER — INSULIN ASPART 100 UNIT/ML ~~LOC~~ SOLN
0.0000 [IU] | Freq: Three times a day (TID) | SUBCUTANEOUS | Status: DC
  Administered 2015-09-09: 5 [IU] via SUBCUTANEOUS
  Administered 2015-09-10 (×2): 3 [IU] via SUBCUTANEOUS
  Administered 2015-09-10: 5 [IU] via SUBCUTANEOUS
  Administered 2015-09-11: 3 [IU] via SUBCUTANEOUS
  Administered 2015-09-11: 5 [IU] via SUBCUTANEOUS
  Administered 2015-09-11: 2 [IU] via SUBCUTANEOUS

## 2015-09-09 MED ORDER — LIDOCAINE HCL (CARDIAC) 20 MG/ML IV SOLN
INTRAVENOUS | Status: AC
  Filled 2015-09-09: qty 5

## 2015-09-09 MED ORDER — ROCURONIUM BROMIDE 100 MG/10ML IV SOLN
INTRAVENOUS | Status: DC | PRN
  Administered 2015-09-09: 50 mg via INTRAVENOUS

## 2015-09-09 MED ORDER — MORPHINE SULFATE (PF) 2 MG/ML IV SOLN
2.0000 mg | INTRAVENOUS | Status: DC | PRN
  Administered 2015-09-09: 2 mg via INTRAVENOUS

## 2015-09-09 MED ORDER — SUGAMMADEX SODIUM 200 MG/2ML IV SOLN
INTRAVENOUS | Status: DC | PRN
  Administered 2015-09-09: 200 mg via INTRAVENOUS

## 2015-09-09 MED ORDER — DOCUSATE SODIUM 100 MG PO CAPS
100.0000 mg | ORAL_CAPSULE | Freq: Every day | ORAL | Status: DC
  Administered 2015-09-10 – 2015-09-12 (×3): 100 mg via ORAL
  Filled 2015-09-09 (×3): qty 1

## 2015-09-09 MED ORDER — PROPOFOL 10 MG/ML IV BOLUS
INTRAVENOUS | Status: DC | PRN
  Administered 2015-09-09: 180 mg via INTRAVENOUS

## 2015-09-09 MED ORDER — INSULIN ASPART 100 UNIT/ML FLEXPEN: 30.0000 [IU] | PEN_INJECTOR | Freq: Every day | SUBCUTANEOUS | Status: DC

## 2015-09-09 MED ORDER — SUGAMMADEX SODIUM 200 MG/2ML IV SOLN
INTRAVENOUS | Status: AC
  Filled 2015-09-09: qty 2

## 2015-09-09 MED ORDER — POTASSIUM CHLORIDE CRYS ER 10 MEQ PO TBCR
10.0000 meq | EXTENDED_RELEASE_TABLET | Freq: Every day | ORAL | Status: DC
  Administered 2015-09-10 – 2015-09-12 (×3): 10 meq via ORAL
  Filled 2015-09-09 (×5): qty 1

## 2015-09-09 MED ORDER — HYDROMORPHONE HCL 1 MG/ML IJ SOLN: 0.2500 mg | INTRAMUSCULAR | Status: DC | PRN

## 2015-09-09 MED ORDER — ACETAMINOPHEN 650 MG RE SUPP
325.0000 mg | RECTAL | Status: DC | PRN
Start: 2015-09-09 — End: 2015-09-12

## 2015-09-09 MED ORDER — PROPOFOL 10 MG/ML IV BOLUS
INTRAVENOUS | Status: AC
  Filled 2015-09-09: qty 20

## 2015-09-09 MED ORDER — VITAMIN D 1000 UNITS PO TABS
1000.0000 [IU] | ORAL_TABLET | Freq: Every day | ORAL | Status: DC
  Administered 2015-09-10 – 2015-09-12 (×3): 1000 [IU] via ORAL
  Filled 2015-09-09 (×3): qty 1

## 2015-09-09 MED ORDER — POTASSIUM CHLORIDE CRYS ER 20 MEQ PO TBCR
20.0000 meq | EXTENDED_RELEASE_TABLET | Freq: Every day | ORAL | Status: AC | PRN
Start: 2015-09-09 — End: 2015-09-11
  Administered 2015-09-11: 20 meq via ORAL
  Filled 2015-09-09: qty 1

## 2015-09-09 MED ORDER — ASPIRIN EC 81 MG PO TBEC
81.0000 mg | DELAYED_RELEASE_TABLET | Freq: Every day | ORAL | Status: DC
  Administered 2015-09-10 – 2015-09-12 (×3): 81 mg via ORAL
  Filled 2015-09-09 (×3): qty 1

## 2015-09-09 MED ORDER — ACETAMINOPHEN 325 MG PO TABS
325.0000 mg | ORAL_TABLET | ORAL | Status: DC | PRN
Start: 2015-09-09 — End: 2015-09-12

## 2015-09-09 MED ORDER — FENTANYL CITRATE (PF) 250 MCG/5ML IJ SOLN
INTRAMUSCULAR | Status: AC
  Filled 2015-09-09: qty 5

## 2015-09-09 MED ORDER — SODIUM CHLORIDE 0.9 % IV SOLN
INTRAVENOUS | Status: DC
  Administered 2015-09-10: 06:00:00 via INTRAVENOUS

## 2015-09-09 MED ORDER — ALBUTEROL SULFATE HFA 108 (90 BASE) MCG/ACT IN AERS
INHALATION_SPRAY | RESPIRATORY_TRACT | Status: DC | PRN
  Administered 2015-09-09 (×2): 4 via RESPIRATORY_TRACT

## 2015-09-09 MED ORDER — PROMETHAZINE HCL 25 MG/ML IJ SOLN: 6.2500 mg | INTRAMUSCULAR | Status: DC | PRN

## 2015-09-09 MED ORDER — HEMOSTATIC AGENTS (NO CHARGE) OPTIME
TOPICAL | Status: DC | PRN
  Administered 2015-09-09: 1 via TOPICAL

## 2015-09-09 MED ORDER — GUAIFENESIN-DM 100-10 MG/5ML PO SYRP
15.0000 mL | ORAL_SOLUTION | ORAL | Status: DC | PRN
Start: 2015-09-09 — End: 2015-09-12

## 2015-09-09 MED ORDER — LABETALOL HCL 5 MG/ML IV SOLN
10.0000 mg | INTRAVENOUS | Status: DC | PRN
  Administered 2015-09-10 (×2): 10 mg via INTRAVENOUS
  Filled 2015-09-09 (×2): qty 4

## 2015-09-09 MED ORDER — INSULIN DETEMIR 100 UNIT/ML ~~LOC~~ SOLN
30.0000 [IU] | Freq: Every day | SUBCUTANEOUS | Status: DC
  Administered 2015-09-09 – 2015-09-11 (×3): 30 [IU] via SUBCUTANEOUS
  Filled 2015-09-09 (×4): qty 0.3

## 2015-09-09 MED ORDER — PANTOPRAZOLE SODIUM 40 MG PO TBEC
40.0000 mg | DELAYED_RELEASE_TABLET | Freq: Every day | ORAL | Status: DC
  Administered 2015-09-09 – 2015-09-12 (×4): 40 mg via ORAL
  Filled 2015-09-09 (×4): qty 1

## 2015-09-09 MED ORDER — OXYCODONE-ACETAMINOPHEN 5-325 MG PO TABS
1.0000 | ORAL_TABLET | ORAL | Status: DC | PRN
  Administered 2015-09-09: 1 via ORAL
  Administered 2015-09-09 – 2015-09-10 (×2): 2 via ORAL
  Administered 2015-09-10: 1 via ORAL
  Filled 2015-09-09: qty 2
  Filled 2015-09-09: qty 1
  Filled 2015-09-09: qty 2
  Filled 2015-09-09: qty 1

## 2015-09-09 MED ORDER — GLYCOPYRROLATE 0.2 MG/ML IJ SOLN
INTRAMUSCULAR | Status: DC | PRN
  Administered 2015-09-09: 0.2 mg via INTRAVENOUS

## 2015-09-09 MED ORDER — HYDRALAZINE HCL 20 MG/ML IJ SOLN: 5.0000 mg | INTRAMUSCULAR | Status: DC | PRN

## 2015-09-09 MED ORDER — ONDANSETRON HCL 4 MG/2ML IJ SOLN
INTRAMUSCULAR | Status: DC | PRN
  Administered 2015-09-09: 4 mg via INTRAVENOUS

## 2015-09-09 MED ORDER — LISINOPRIL 10 MG PO TABS
10.0000 mg | ORAL_TABLET | Freq: Every day | ORAL | Status: DC
  Administered 2015-09-09 – 2015-09-12 (×4): 10 mg via ORAL
  Filled 2015-09-09 (×4): qty 1

## 2015-09-09 MED ORDER — OMEGA-3 FISH OIL 1200 MG PO CAPS: 1200.0000 mg | ORAL_CAPSULE | Freq: Every day | ORAL | Status: DC

## 2015-09-09 MED ORDER — FENTANYL CITRATE (PF) 100 MCG/2ML IJ SOLN
INTRAMUSCULAR | Status: DC | PRN
  Administered 2015-09-09 (×4): 50 ug via INTRAVENOUS
  Administered 2015-09-09: 100 ug via INTRAVENOUS
  Administered 2015-09-09 (×2): 50 ug via INTRAVENOUS

## 2015-09-09 MED ORDER — ARTIFICIAL TEARS OP OINT
TOPICAL_OINTMENT | OPHTHALMIC | Status: DC | PRN
  Administered 2015-09-09: 1 via OPHTHALMIC

## 2015-09-09 MED ORDER — FERROUS SULFATE 325 (65 FE) MG PO TABS
325.0000 mg | ORAL_TABLET | Freq: Every day | ORAL | Status: DC
  Administered 2015-09-10 – 2015-09-12 (×3): 325 mg via ORAL
  Filled 2015-09-09 (×3): qty 1

## 2015-09-09 MED ORDER — PHENYLEPHRINE HCL 10 MG/ML IJ SOLN
10.0000 mg | INTRAVENOUS | Status: DC | PRN
  Administered 2015-09-09: 15 ug/min via INTRAVENOUS

## 2015-09-09 MED ORDER — ROSUVASTATIN CALCIUM 40 MG PO TABS
40.0000 mg | ORAL_TABLET | Freq: Every day | ORAL | Status: DC
  Administered 2015-09-09 – 2015-09-11 (×3): 40 mg via ORAL
  Filled 2015-09-09 (×3): qty 1

## 2015-09-09 MED ORDER — HEPARIN SODIUM (PORCINE) 1000 UNIT/ML IJ SOLN
INTRAMUSCULAR | Status: DC | PRN
  Administered 2015-09-09: 9000 [IU] via INTRAVENOUS
  Administered 2015-09-09: 1000 [IU] via INTRAVENOUS

## 2015-09-09 MED ORDER — MIDAZOLAM HCL 2 MG/2ML IJ SOLN
INTRAMUSCULAR | Status: AC
  Filled 2015-09-09: qty 2

## 2015-09-09 MED ORDER — METOPROLOL TARTRATE 1 MG/ML IV SOLN
2.0000 mg | INTRAVENOUS | Status: DC | PRN
Start: 2015-09-09 — End: 2015-09-12

## 2015-09-09 MED ORDER — MAGNESIUM SULFATE 2 GM/50ML IV SOLN
2.0000 g | Freq: Every day | INTRAVENOUS | Status: DC | PRN
  Filled 2015-09-09: qty 50

## 2015-09-09 MED ORDER — BISACODYL 10 MG RE SUPP: 10.0000 mg | Freq: Every day | RECTAL | Status: DC | PRN

## 2015-09-09 MED ORDER — 0.9 % SODIUM CHLORIDE (POUR BTL) OPTIME
TOPICAL | Status: DC | PRN
  Administered 2015-09-09: 2000 mL

## 2015-09-09 MED ORDER — SCOPOLAMINE 1 MG/3DAYS TD PT72
1.0000 | MEDICATED_PATCH | TRANSDERMAL | Status: DC
  Administered 2015-09-09: 1.5 mg via TRANSDERMAL
  Filled 2015-09-09: qty 1

## 2015-09-09 MED ORDER — PROTAMINE SULFATE 10 MG/ML IV SOLN
INTRAVENOUS | Status: DC | PRN
  Administered 2015-09-09: 30 mg via INTRAVENOUS

## 2015-09-09 MED ORDER — MIDAZOLAM HCL 5 MG/5ML IJ SOLN
INTRAMUSCULAR | Status: DC | PRN
  Administered 2015-09-09: 2 mg via INTRAVENOUS

## 2015-09-09 SURGICAL SUPPLY — 62 items
BAG ISOLATION DRAPE 18X18 (DRAPES) ×2 IMPLANT
BNDG GAUZE ELAST 4 BULKY (GAUZE/BANDAGES/DRESSINGS) ×4 IMPLANT
CANISTER SUCTION 2500CC (MISCELLANEOUS) ×4 IMPLANT
CLIP TI MEDIUM 24 (CLIP) ×4 IMPLANT
CLIP TI WIDE RED SMALL 24 (CLIP) ×4 IMPLANT
COVER PROBE W GEL 5X96 (DRAPES) ×4 IMPLANT
DRAIN CHANNEL 15F RND FF W/TCR (WOUND CARE) IMPLANT
DRAPE C-ARM 42X72 X-RAY (DRAPES) IMPLANT
DRAPE ISOLATION BAG 18X18 (DRAPES) ×2
DRSG EMULSION OIL 3X3 NADH (GAUZE/BANDAGES/DRESSINGS) ×4 IMPLANT
ELECT REM PT RETURN 9FT ADLT (ELECTROSURGICAL) ×8
ELECTRODE REM PT RTRN 9FT ADLT (ELECTROSURGICAL) ×4 IMPLANT
EVACUATOR SILICONE 100CC (DRAIN) IMPLANT
GAUZE SPONGE 4X4 16PLY XRAY LF (GAUZE/BANDAGES/DRESSINGS) ×8 IMPLANT
GLOVE BIO SURGEON STRL SZ 6.5 (GLOVE) ×3 IMPLANT
GLOVE BIO SURGEON STRL SZ7 (GLOVE) ×12 IMPLANT
GLOVE BIO SURGEON STRL SZ7.5 (GLOVE) ×4 IMPLANT
GLOVE BIO SURGEONS STRL SZ 6.5 (GLOVE) ×1
GLOVE BIOGEL PI IND STRL 6.5 (GLOVE) ×6 IMPLANT
GLOVE BIOGEL PI IND STRL 7.0 (GLOVE) ×8 IMPLANT
GLOVE BIOGEL PI IND STRL 7.5 (GLOVE) ×6 IMPLANT
GLOVE BIOGEL PI INDICATOR 6.5 (GLOVE) ×6
GLOVE BIOGEL PI INDICATOR 7.0 (GLOVE) ×8
GLOVE BIOGEL PI INDICATOR 7.5 (GLOVE) ×6
GLOVE ECLIPSE 6.5 STRL STRAW (GLOVE) ×8 IMPLANT
GLOVE ECLIPSE 7.0 STRL STRAW (GLOVE) ×4 IMPLANT
GOWN STRL REUS W/ TWL LRG LVL3 (GOWN DISPOSABLE) ×6 IMPLANT
GOWN STRL REUS W/TWL LRG LVL3 (GOWN DISPOSABLE) ×10 IMPLANT
GOWN STRL REUS W/TWL XL LVL3 (GOWN DISPOSABLE) ×4 IMPLANT
GRAFT PROPATEN W/RING 6X80X60 (Vascular Products) ×4 IMPLANT
HEMOSTAT SPONGE AVITENE ULTRA (HEMOSTASIS) ×4 IMPLANT
KIT BASIN OR (CUSTOM PROCEDURE TRAY) ×4 IMPLANT
KIT ROOM TURNOVER OR (KITS) ×4 IMPLANT
LIQUID BAND (GAUZE/BANDAGES/DRESSINGS) ×16 IMPLANT
LOOP VESSEL MINI RED (MISCELLANEOUS) ×8 IMPLANT
MARKER SKIN DUAL TIP RULER LAB (MISCELLANEOUS) ×4 IMPLANT
NS IRRIG 1000ML POUR BTL (IV SOLUTION) ×8 IMPLANT
PACK PERIPHERAL VASCULAR (CUSTOM PROCEDURE TRAY) ×4 IMPLANT
PAD ARMBOARD 7.5X6 YLW CONV (MISCELLANEOUS) ×8 IMPLANT
SET MICROPUNCTURE 5F STIFF (MISCELLANEOUS) IMPLANT
SPONGE GAUZE 4X4 12PLY STER LF (GAUZE/BANDAGES/DRESSINGS) ×4 IMPLANT
STAPLER VISISTAT 35W (STAPLE) IMPLANT
STOPCOCK 4 WAY LG BORE MALE ST (IV SETS) IMPLANT
SUT ETHILON 3 0 PS 1 (SUTURE) ×8 IMPLANT
SUT GORETEX 5 0 TT13 24 (SUTURE) ×4 IMPLANT
SUT GORETEX 6.0 TT13 (SUTURE) ×4 IMPLANT
SUT MNCRL AB 4-0 PS2 18 (SUTURE) ×16 IMPLANT
SUT PROLENE 5 0 C 1 24 (SUTURE) ×4 IMPLANT
SUT PROLENE 6 0 BV (SUTURE) ×12 IMPLANT
SUT PROLENE 7 0 BV 1 (SUTURE) IMPLANT
SUT SILK 2 0 FS (SUTURE) IMPLANT
SUT SILK 2 0 SH (SUTURE) ×12 IMPLANT
SUT SILK 3 0 (SUTURE) ×2
SUT SILK 3-0 18XBRD TIE 12 (SUTURE) ×2 IMPLANT
SUT VIC AB 2-0 CT1 27 (SUTURE) ×4
SUT VIC AB 2-0 CT1 TAPERPNT 27 (SUTURE) ×4 IMPLANT
SUT VIC AB 3-0 SH 27 (SUTURE) ×8
SUT VIC AB 3-0 SH 27X BRD (SUTURE) ×8 IMPLANT
TRAY FOLEY W/METER SILVER 16FR (SET/KITS/TRAYS/PACK) ×4 IMPLANT
TUBING EXTENTION W/L.L. (IV SETS) IMPLANT
UNDERPAD 30X30 INCONTINENT (UNDERPADS AND DIAPERS) ×4 IMPLANT
WATER STERILE IRR 1000ML POUR (IV SOLUTION) ×4 IMPLANT

## 2015-09-09 NOTE — Progress Notes (Signed)
Dr Bridgett Larsson called and states that he filled out the consent and discussed it with the patient. Dr Tobias Alexander in pt room informed of bp of 215/78

## 2015-09-09 NOTE — Progress Notes (Signed)
Utilization review completed.  

## 2015-09-09 NOTE — Op Note (Addendum)
OPERATIVE NOTE   PROCEDURE: 1.  Right common femoral artery to below-the-knee popliteal artery bypass with 6 mm Proptaten 2.  Right second toe amputation 3.  Left leg exploration  PRE-OPERATIVE DIAGNOSIS: Right second toe gangrene with exposed distal phalange  POST-OPERATIVE DIAGNOSIS: same as above   SURGEON: Adele Barthel, MD  ASSISTANT(S): Gerri Lins, PAC; Silva Bandy, [facility]   ANESTHESIA: general  ESTIMATED BLOOD LOSS: 200 cc  FINDING(S): 1.  Left greater saphenous vein inadequate size at left calf level: <2 mm 2.  Diseased and calcified right common femoral artery and below-the-knee popliteal artery 3.  Dopplerable right posterior tibial artery and anterior tibial artery at end of case  4.  Good bleeding from right second toe amputation wound  SPECIMEN(S):  none  INDICATIONS:   Traci Raymond is a 65 y.o. female who presents with exposed right second distal phalange.  Based on a recent angiogram, I felt her best chance of right foot salvage was proceeding with a right femoropopliteal bypass and amputating the right second toe.  The risk, benefits, and alternative for bypass operations were discussed with the patient.  The patient is aware the risks include but are not limited to: bleeding, infection, myocardial infarction, stroke, limb loss, nerve damage, need for additional procedures in the future, wound complications, need for proximal amputation, and inability to complete the bypass.  The patient is aware of these risks and agreed to proceed.   DESCRIPTION: After full informed written consent was obtained, the patient was brought back to the operating room and placed supine upon the operating table.  Prior to induction, the patient was given intravenous antibiotics.  After obtaining adequate anesthesia, the patient was prepped and draped in the standard fashion for a femoral to popliteal bypass operation.  Attention was turned to the right groin.  A longitudinal  incision was made over the right common femoral artery.  Using blunt dissection and electrocautery, the artery was dissected out from the inguinal ligament down to the femoral bifurcation.  This was extremely difficult as there was extensive inflammation in this groin.  This was similar to a redo femoral exposure.  The superficial femoral artery, two profunda femoral arteries, and external iliac artery were dissected out and vessel loops applied.   This patient had a high bifurcation which underneath the inguinal ligament.  This common femoral artery was found on exam to be calcified and diseased.  The profunda femoral artery was soft with minimal disease evident.    At this point, attention was turned to the calf.  An longitudinal incision was made one finger-width posterior to the tibia.  Using blunt dissection and electrocautery, a plane was developed through the subcutaneous tissue and fascia down to the popliteal space.  The popliteal vein was dissected out and retracted medially and posteriorly.  The below-the-knee popliteal artery was dissected away from lateral popliteal vein.  This popliteal artery was found on exam to be diseased without obvious calcification.  This patient's right greater saphenous vein was found to be too small on vein mapping, so I felt the left greater saphenous vein needed to be explored.  I turned my attention to the left calf.  I marked on the skin the location of the left greater saphenous vein.  I made an incision over the calf segment of this vein.  The vein conduit was found to be inadequate with size: <2 mm.  I then dissected above the knee and found the greater saphenous vein to also  be inadequate at this level: 2 mm.  Subsequently, I felt using vein as a conduit was not possible in this patient.  At this point, I bluntly dissected a space between the femoral condyles adjacent to the below-the-knee popliteal vessels.  I bluntly passed the long Gore metal tunneler between  the femoral condyles in a subsartorial fashion to the groin incision.  The bullet on the tunneler was removed and then the 6 mm Propaten conduit was sewn to the inner cannula with a 2-0 Silk.  I passed the conduit through the metal tunnel, taking care to maintain the orientation of the conduit.    At this point, the patient was given 9000 units of Heparin intravenously, which was a therapeutic bolus.  In total, 10000 units of Heparin was administrated to achieve and maintain a therapeutic level of anticoagulation, giving another 1000 units every subsequent hourly.  After waiting three minutes, the external iliac artery and two profunda femoral arteries were clamped.  An incision was made in the common femoral artery and extended proximally and distally with a Potts scissor.  This was difficult due to calcification and thickening of the wall.  The proximal conduit was spatulated to the dimensions of the arteriotomy.  The conduit was sewn to the common femoral artery with a running stitch of CV-5.  Prior to completing this anastomosis, all vessels were backbled.  No thrombus was noted from any vessels and backbleeding was: vigorous.  The anastomosis was completed in the usual fashion.  Attention was then turned to the popliteal exposure.   I reset the exposure of the popliteal space.  I verified the popliteal artery was appropriately marked.  I determine the target segment for the anastomosis.  I applied tension to the artery proximally and distally with vessel loops.  I made an incision with a 11-blade in the artery and extended it proximally and distally with a Potts scissor.  This artery was found to be diseased and calcified.  I pulled the conduit to appropriate tension and length, taking into account straightening out the leg.  I adjusted the length of the conduit sharply.  I pulled off excess external rings from the conduit.  I spatulated this conduit to meet the dimensions of the arteriotomy.  The conduit  was sewn to the common femoral artery with a running stitch of CV-6.  Prior to completing this anastomosis, all vessels were backbled.  No thrombus was noted from any vessels and backbleeding was: limited.  The bypass conduit was allowed to bleed in an antegrade fashion.  The bleeding was: pulsatile.  The anastomosis was completed in the usual fashion.  At this point, all incisions were washed out and Avitene was placed into both incisions.  The continuous doppler exam demonstrated distally: greatly augmented distal tibial artery signals with biphasic signals that abated with compression of the graft.  I gave the patient 30 mg of Protamine to reverse anticoagulation.  At this point, bleeding in both incisions were controlled with electrocautery and suture ligature.  After another round of Avitene, no further active bleeding was present.  I washed out both incisions.  The calf was repaired with interrupted deep sutures to reapproximate the deep muscles and a running stitch of 3-0 Vicryl in the subcutaneous tissue.  The skin was reapproximated with a running subcuticular of 4-0 Vicryl.  After cleaning and drying the skin, the skin was reinforced with Dermabond.  Attention was turned to the groin.  The groin was repaired with a double  layer of 2-0 Vicryl immediately superficial to the bypass conduit.  The superficial subcutaneous tissue was reapproximated with a double layer of 3-0 Vicryl.  The skin was reapproximated with a running subcuticular of 4-0 Vicryl.  The skin was cleaned, dried, and reinforced with Dermabond.  The left leg vein harvest incisions were closed with a layer of 3-0 Vicryl in the subcutaneous tissue.  The skin was then reapproximated with 4-0 Monocryl.  The skin was cleaned, dried, and Dermabond used to reinforce the skin closured.  At this point, I turned my attention to the right foot.  I made a racquet incision around the right second toe.  I dissected through the subcutaneous tissue,  vessels, and tendons with electrocautery until I released the entirety of the right second toe.  I passed the toe off the field.  There was extensive bleeding from the wound bed, which was controlled with electrocautery.  I reapproximated the subcutaneous tissue with horizontal mattress stitches of 3-0 Vicryl.  I reapproximated the skin with interrupted stitches of 4-0 Nylon.  The skin was cleaned, dried, and then the right second amputation site was padded with fluffs.  A sterile bandage was applied and held in place with a Kerlix.      COMPLICATIONS: none  CONDITION: stable   Adele Barthel, MD [facility] Office: [phone] Pager: [phone]  09/09/2015, 12:49 PM

## 2015-09-09 NOTE — Anesthesia Procedure Notes (Signed)
Procedure Name: Intubation Date/Time: 09/09/2015 8:35 AM Performed by: Clearnce Sorrel Pre-anesthesia Checklist: Patient identified, Timeout performed, Emergency Drugs available, Suction available and Patient being monitored Patient Re-evaluated:Patient Re-evaluated prior to inductionOxygen Delivery Method: Circle system utilized Preoxygenation: Pre-oxygenation with 100% oxygen Intubation Type: IV induction Ventilation: Mask ventilation without difficulty Laryngoscope Size: Mac and 3 Grade View: Grade I Tube type: Oral Tube size: 7.0 mm Number of attempts: 1 Airway Equipment and Method: Stylet Placement Confirmation: ETT inserted through vocal cords under direct vision,  breath sounds checked- equal and bilateral and positive ETCO2 Secured at: 22 cm Tube secured with: Tape Dental Injury: Teeth and Oropharynx as per pre-operative assessment

## 2015-09-09 NOTE — H&P (View-Only) (Signed)
Established Critical Limb Ischemia Patient  History of Present Illness  Traci Raymond is a 65 y.o. ([birth year]) female who presents with chief complaint: R 2nd toe gangrene.  The patient has no rest pain and wounds include: R 2nd toe with exposed distal phalange.  The patient notes symptoms have mpt progressed.  The patient's treatment regimen currently included: maximal medical management.  Her recent angiogram demonstrated occluded bilateral SFA stents.  The patient has a R BK pop target.   Past Medical History  Diagnosis Date  . Cancer (Start)     Uterian  . Diabetes mellitus without complication ([place])   . Hypertension   . Hyperlipidemia     Controlled by Rx    Past Surgical History  Procedure Laterality Date  . Abdominal hysterectomy    . Stents Bilateral     LE Right Leg one stent and Left has 2 stents  . Peripheral vascular catheterization N/A 08/27/2015    Procedure: Abdominal Aortogram;  Surgeon: Conrad Savanna, MD;  Location: [facility];  Service: Cardiovascular;  Laterality: N/A;    Social History   Social History  . Marital Status: Widowed    Spouse Name: N/A  . Number of Children: N/A  . Years of Education: N/A   Occupational History  . Not on file.   Social History Main Topics  . Smoking status: Current Every Day Smoker -- 0.50 packs/day    Types: Cigarettes  . Smokeless tobacco: Never Used  . Alcohol Use: No  . Drug Use: Yes  . Sexual Activity: Not on file   Other Topics Concern  . Not on file   Social History Narrative    Family History  Problem Relation Age of Onset  . Diabetes Mother   . Hypertension Mother   . Hyperlipidemia Mother   . Diabetes Father   . Hyperlipidemia Father   . Hypertension Father     Current Outpatient Prescriptions  Medication Sig Dispense Refill  . aspirin EC 81 MG tablet Take 81 mg by mouth daily.    . cholecalciferol (VITAMIN D) 1000 units tablet Take 1,000 Units by mouth daily.    . clopidogrel  (PLAVIX) 75 MG tablet Take 75 mg by mouth daily.     . ferrous sulfate 325 (65 FE) MG tablet Take 325 mg by mouth daily with breakfast.    . LEVEMIR FLEXTOUCH 100 UNIT/ML Pen Inject 30 Units into the skin daily at 10 pm.     . lisinopril (PRINIVIL,ZESTRIL) 10 MG tablet Take 10 mg by mouth daily.     . metFORMIN (GLUCOPHAGE) 1000 MG tablet Take 1,000 mg by mouth 2 (two) times daily with a meal.     . NOVOLOG FLEXPEN 100 UNIT/ML FlexPen Inject 30 Units into the skin daily.     . Omega-3 Fatty Acids (OMEGA-3 FISH OIL) 1200 MG CAPS Take 1,200 mg by mouth daily.    . potassium chloride (K-DUR) 10 MEQ tablet Take 10 mEq by mouth daily.     . rosuvastatin (CRESTOR) 40 MG tablet Take 40 mg by mouth daily.     Marland Kitchen omega-3 acid ethyl esters (LOVAZA) 1 g capsule      No current facility-administered medications for this visit.     No Known Allergies   REVIEW OF SYSTEMS:  (Positives checked otherwise negative)  CARDIOVASCULAR:   [ ]  chest pain,  [ ]  chest pressure,  [ ]  palpitations,  [ ]  shortness of breath when laying flat,  [ ]   shortness of breath with exertion,   [x]  pain in feet when walking,  [x]  pain in feet when laying flat, [ ]  history of blood clot in veins (DVT),  [ ]  history of phlebitis,  [ ]  swelling in legs,  [ ]  varicose veins  PULMONARY:   [ ]  productive cough,  [ ]  asthma,  [ ]  wheezing  NEUROLOGIC:   [ ]  weakness in arms or legs,  [ ]  numbness in arms or legs,  [ ]  difficulty speaking or slurred speech,  [ ]  temporary loss of vision in one eye,  [ ]  dizziness  HEMATOLOGIC:   [ ]  bleeding problems,  [ ]  problems with blood clotting too easily  MUSCULOSKEL:   [ ]  joint pain, [ ]  joint swelling  GASTROINTEST:   [ ]  vomiting blood,  [ ]  blood in stool     GENITOURINARY:   [ ]  burning with urination,  [ ]  blood in urine  PSYCHIATRIC:   [ ]  history of major depression  INTEGUMENTARY:   [ ]  rashes,  [x]  ulcers  CONSTITUTIONAL:   [ ]  fever,  [ ]   chills   Physical Examination  Filed Vitals:   09/04/15 1448 09/04/15 1455  BP: 168/70 170/68  Pulse: 86 88  Temp: 97.2 F (36.2 C)   TempSrc: Oral   Resp: 18   Height: 5\' 1"  (1.549 m)   Weight: 193 lb (87.544 kg)   SpO2: 98%    Body mass index is 36.49 kg/(m^2).   General: A&O x 3, WD, obese  Pulmonary: Sym exp, good air movt, CTAB, no rales, rhonchi, & wheezing  Cardiac: RRR, Nl S1, S2, no Murmurs, rubs or gallops  Vascular: Vessel Right Left  Radial Palpable Palpable  Brachial Palpable Palpable  Carotid Palpable, without bruit Palpable, without bruit  Aorta Not palpable N/A  Femoral Palpable Palpable  Popliteal Not palpable Not palpable  PT Not Palpable Not Palpable  DP Not Palpable Not Palpable   Gastrointestinal: soft, NTND, -G/R, - HSM, - masses, - CVAT B  Musculoskeletal: M/S 5/5 throughout including full DF/PF in both feet, Extremities without ischemic changes except Right 2nd toe ischemic with exposed distal phalange bone, ischemic R 2nd toe  Neurologic: CN 2-12 intact , Pain and light touch intact in extremities including R foot, Motor exam as listed above  BLE GSV Mapping (09/01/15)  R: marginal vein  L: marginal but possibly acceptable   Medical Decision Making  Traci Raymond is a 65 y.o. female who presents with: RLE critical limb ischemia with exposed R 2nd distal phalange   Based on the patient's vascular studies and examination, I have offered the patient: R CFA to BK pop bypass with ips GSV vs Propaten  The risk, benefits, and alternative for bypass operations were discussed with the patient.  The patient is aware the risks include but are not limited to: bleeding, infection, myocardial infarction, stroke, limb loss, nerve damage, need for additional procedures in the future, wound complications, and inability to complete the bypass.    The patient is aware of these risks and agreed to proceed.  She is scheduled  for 1 FEB 17.  I discussed in depth with the patient the nature of atherosclerosis, and emphasized the importance of maximal medical management including strict control of blood pressure, blood glucose, and lipid levels, antiplatelet agents, obtaining regular exercise, and cessation of smoking.    The patient is aware that without maximal medical management the underlying atherosclerotic disease process  will progress, limiting the benefit of any interventions.  The patient is currently on a statin: Crestor.  The patient is currently on an anti-platelet: ASA, Plavix.  Thank you for allowing [place] to participate in this patient's care.   Adele Barthel, MD [facility] [place] Office: [phone] Pager: [phone]  09/04/2015, 3:31 PM

## 2015-09-09 NOTE — Anesthesia Postprocedure Evaluation (Signed)
Anesthesia Post Note  Patient: Traci Raymond  Procedure(s) Performed: Procedure(s) (LRB): BYPASS GRAFT RIGHT COMMON FEMORAL-BELOW KNEE POPLITEAL ARTERY USING GORETEX PROPATEN 6MM X 80CM GRAFT (Right) AMPUTATION TOE-RIGHT SECOND  (Right)  Patient location during evaluation: PACU Anesthesia Type: General Level of consciousness: awake and alert Pain management: pain level controlled Vital Signs Assessment: post-procedure vital signs reviewed and stable Respiratory status: spontaneous breathing, nonlabored ventilation, respiratory function stable and patient connected to nasal cannula oxygen Cardiovascular status: blood pressure returned to baseline and stable Postop Assessment: no signs of nausea or vomiting Anesthetic complications: no    Last Vitals:  Filed Vitals:   09/09/15 1416 09/09/15 1439  BP: 146/76   Pulse:    Temp:  37.2 C  Resp:      Last Pain: There were no vitals filed for this visit.               Reigan Tolliver,W. EDMOND

## 2015-09-09 NOTE — Interval H&P Note (Signed)
[facility] [place]  History and Physical Update  The patient was interviewed and re-examined.  The patient's previous History and Physical has been reviewed and is unchanged from my consult except for: change in L of GSV harvest.  Plan is: R CFA to BK pop BPG w/ L GSV, L  2nd toe amputation.  I have discussed these changes with the patient.  The risk, benefits, and alternative for bypass operations were discussed with the patient.  The patient is aware the risks include but are not limited to: bleeding, infection, myocardial infarction, stroke, limb loss, nerve damage, need for additional procedures in the future, wound complications, need for more proximal amputation, and inability to complete the bypass.  The patient is aware of these risks and agreed to proceed.   Adele Barthel, MD [facility] [place] Office: [phone] Pager: [phone]  09/09/2015, 7:33 AM

## 2015-09-09 NOTE — Progress Notes (Signed)
  Day of Surgery Note    Subjective:  No complaints-says her foot feels better; wants to walk  Filed Vitals:   09/09/15 1439 09/09/15 1455  BP:  155/60  Pulse:    Temp: 99 F (37.2 C) 98.9 F (37.2 C)  Resp:      Incisions:   Right groin and BK incision are clean and dry Extremities:  Brisk right PT doppler signal; right foot dressing in tact Lungs:  Non labored   Assessment/Plan:  This is a 65 y.o. female who is s/p  1. Right common femoral artery to below-the-knee popliteal artery bypass with 6 mm Proptaten 2. Right second toe amputation 3. Left leg exploration  -pt doing well with brisk right PT doppler signal -plan on dc foley tomorrow morning and mobilizing   Leontine Locket, PA-C 09/09/2015 5:43 PM

## 2015-09-09 NOTE — Transfer of Care (Signed)
Immediate Anesthesia Transfer of Care Note  Patient: Traci Raymond  Procedure(s) Performed: Procedure(s): BYPASS GRAFT RIGHT COMMON FEMORAL-BELOW KNEE POPLITEAL ARTERY USING GORETEX PROPATEN 6MM X 80CM GRAFT (Right) AMPUTATION TOE-RIGHT SECOND  (Right)  Patient Location: PACU  Anesthesia Type:General  Level of Consciousness: awake, alert  and oriented  Airway & Oxygen Therapy: Patient Spontanous Breathing and Patient connected to face mask oxygen  Post-op Assessment: Report given to RN and Post -op Vital signs reviewed and stable  Post vital signs: Reviewed and stable  Last Vitals:  Filed Vitals:   09/09/15 0746  BP: 215/78  Pulse: 87  Temp: 36.9 C  Resp: 18    Complications: No apparent anesthesia complications

## 2015-09-10 ENCOUNTER — Encounter (HOSPITAL_COMMUNITY): Payer: Self-pay | Admitting: Vascular Surgery

## 2015-09-10 ENCOUNTER — Inpatient Hospital Stay (HOSPITAL_COMMUNITY): Payer: Medicare Other

## 2015-09-10 DIAGNOSIS — I70269 Atherosclerosis of native arteries of extremities with gangrene, unspecified extremity: Secondary | ICD-10-CM

## 2015-09-10 LAB — URINE MICROSCOPIC-ADD ON

## 2015-09-10 LAB — URINALYSIS, ROUTINE W REFLEX MICROSCOPIC
BILIRUBIN URINE: NEGATIVE
GLUCOSE, UA: 250 mg/dL — AB
Ketones, ur: NEGATIVE mg/dL
Leukocytes, UA: NEGATIVE
Nitrite: NEGATIVE
Protein, ur: >300 mg/dL — AB
SPECIFIC GRAVITY, URINE: 1.027 (ref 1.005–1.030)
pH: 5.5 (ref 5.0–8.0)

## 2015-09-10 LAB — BASIC METABOLIC PANEL
Anion gap: 10 (ref 5–15)
BUN: 9 mg/dL (ref 6–20)
CO2: 28 mmol/L (ref 22–32)
CREATININE: 1.42 mg/dL — AB (ref 0.44–1.00)
Calcium: 8.5 mg/dL — ABNORMAL LOW (ref 8.9–10.3)
Chloride: 102 mmol/L (ref 101–111)
GFR calc non Af Amer: 38 mL/min — ABNORMAL LOW (ref >60)
GFR, EST AFRICAN AMERICAN: 44 mL/min — AB (ref >60)
Glucose, Bld: 225 mg/dL — ABNORMAL HIGH (ref 65–99)
Potassium: 3.9 mmol/L (ref 3.5–5.1)
Sodium: 140 mmol/L (ref 135–145)

## 2015-09-10 LAB — GLUCOSE, CAPILLARY
GLUCOSE-CAPILLARY: 205 mg/dL — AB (ref 65–99)
GLUCOSE-CAPILLARY: 149 mg/dL — AB (ref 65–99)
Glucose-Capillary: 173 mg/dL — ABNORMAL HIGH (ref 65–99)
Glucose-Capillary: 181 mg/dL — ABNORMAL HIGH (ref 65–99)
Glucose-Capillary: 233 mg/dL — ABNORMAL HIGH (ref 65–99)
Glucose-Capillary: 175 mg/dL — ABNORMAL HIGH (ref 65–99)

## 2015-09-10 LAB — CBC
HEMATOCRIT: 34 % — AB (ref 36.0–46.0)
HEMOGLOBIN: 10.7 g/dL — AB (ref 12.0–15.0)
MCH: 25.3 pg — AB (ref 26.0–34.0)
MCHC: 31.5 g/dL (ref 30.0–36.0)
MCV: 80.4 fL (ref 78.0–100.0)
Platelets: 210 10*3/uL (ref 150–400)
RBC: 4.23 MIL/uL (ref 3.87–5.11)
RDW: 16.5 % — ABNORMAL HIGH (ref 11.5–15.5)
WBC: 12.6 10*3/uL — ABNORMAL HIGH (ref 4.0–10.5)

## 2015-09-10 LAB — MRSA PCR SCREENING: MRSA by PCR: NEGATIVE

## 2015-09-10 MED ORDER — TRAMADOL HCL 50 MG PO TABS
50.0000 mg | ORAL_TABLET | Freq: Four times a day (QID) | ORAL | Status: DC | PRN
  Administered 2015-09-11 – 2015-09-12 (×2): 50 mg via ORAL
  Filled 2015-09-10 (×2): qty 1

## 2015-09-10 MED ORDER — CLOPIDOGREL BISULFATE 75 MG PO TABS
75.0000 mg | ORAL_TABLET | Freq: Every day | ORAL | Status: DC
  Administered 2015-09-11 – 2015-09-12 (×2): 75 mg via ORAL
  Filled 2015-09-10 (×2): qty 1

## 2015-09-10 MED ORDER — MORPHINE SULFATE (PF) 2 MG/ML IV SOLN
1.0000 mg | INTRAVENOUS | Status: DC | PRN
  Administered 2015-09-11: 2 mg via INTRAVENOUS
  Filled 2015-09-10: qty 1

## 2015-09-10 NOTE — Progress Notes (Addendum)
[facility]  Subjective  - Doing OK, sitting up in chair.   Objective 129/56 84 98.2 F (36.8 C) (Oral) 20 94%  Intake/Output Summary (Last 24 hours) at 09/10/15 0716 Last data filed at 09/10/15 0600  Gross per 24 hour  Intake   2490 ml  Output   1120 ml  Net   1370 ml    Doppler signal PT and peroneal on right foot Incisions soft, healing well  Heart SR bundle branch block noted pre-op Right arm with erythema not warm, soft compartments grip 5/5  Assessment/Planning: POD # 1  PROCEDURE: 1. Right common femoral artery to below-the-knee popliteal artery bypass with 6 mm Proptaten 2. Right second toe amputation 3. Left leg exploration  Pending STAT ABI's today r/o iliac stenosis Ordered hard sole shoe for heel weight bearing ambulation on the right 4 x 4 to right groin due to large panus Transfer to 2W later today  Hep lock IV fluids once taking po's well   Laurence Slate [facility] 09/10/2015 7:16 AM --  Laboratory Lab Results:  Recent Labs  09/09/15 1519 09/10/15 0435  WBC 14.9* 12.6*  HGB 12.2 10.7*  HCT 38.2 34.0*  PLT 219 210   BMET  Recent Labs  09/09/15 0722 09/09/15 1519 09/10/15 0435  NA 139  --  140  K 3.6  --  3.9  CL 102  --  102  CO2 23  --  28  GLUCOSE 247*  --  225*  BUN 7  --  9  CREATININE 1.18* 1.41* 1.42*  CALCIUM 9.2  --  8.5*    COAG Lab Results  Component Value Date   INR 1.07 09/09/2015   No results found for: PTT    Addendum  I have independently interviewed and examined the patient, and I agree with the physician assistant's findings.  Excellent PT signal.  I suspect the ABI are going to demonstrate significant improvement from the baseline 0.49 on R.  In the future, improvement of the inflow with R EIA PTA+S may be needed, as while there was triphasic flow, the right common femoral artery pulse was suboptimal.    - Attention to R groin essential given her pannus overlying that  groin.  Keep dry dressing in place.  Adele Barthel, MD [facility] [place] Office: [phone] Pager: [phone]  09/10/2015, 8:54 AM

## 2015-09-10 NOTE — Progress Notes (Signed)
PT Cancellation Note  Patient Details Name: Traci Raymond MRN: [id number] DOB: 08/15/1950   Cancelled Treatment:    Reason Eval/Treat Not Completed: Medical issues which prohibited therapy.  Pt w/ cognitive change, guessing the year incorrectly thinking it is 60, then 2001, then 2012.  Oriented to person only. When told she is in the hospital she says she's here to see about her neighbor.  RN made aware and in room calling MD.  Will hold PT for now but will continue to follow acutely.  Joslyn Hy PT, DPT [phone] Pager: [phone] 09/10/2015, 2:07 PM

## 2015-09-10 NOTE — Progress Notes (Signed)
VASCULAR LAB PRELIMINARY  ARTERIAL  ABI completed:    RIGHT    LEFT    PRESSURE WAVEFORM  PRESSURE WAVEFORM  BRACHIAL 197 Triphasic BRACHIAL 213 Triphasic  DP 143 Dampened monophasic DP 114 Monophasic  AT   AT    PT 158 Monophasic PT 48 Monophasic  PER   PER    GREAT TOE Not obtained NA GREAT TOE Not obtained NA    RIGHT LEFT  ABI 0.74 0.54   The right ABI is suggestive of moderate arterial insufficiency at rest. The left ABI is suggestive of moderate, borderline severe, arterial insufficiency at rest.  09/10/2015 8:59 AM Maudry Mayhew, RVT, RDCS, RDMS

## 2015-09-10 NOTE — Progress Notes (Addendum)
  [facility] Progress Note  Asked to see patient regarding remote period of confusion with physical therapy this afternoon around 1400. The patient was only oriented to person and could not figure out what year it was. She had a dose of percocet at 1352 and per RN, the patient dropped her drink while trying to take her medicine. At the same time, her O2 sats dropped to the 17s. This did rebound back up to the 90s quickly afterwards. The patient also spiked a fever of 100.4 this afternoon.   The patient is now oriented to person, place, time and situation. She did originally say it was 2015, but quickly corrected herself. There are no focal neurological deficits on exam. No carotid bruits. She is also hypertensive with systolic [id number].   Her symptoms may be residual from anesthesia, narcotic administration, hypertension, or possibly TIA. Her electrolytes are WNL and she is not hypoxic. Will order carotid duplex to evaluate. Limit narcotic use. PRN antihypertensives.  Given low grade fever and presence of prosthetic graft, will order blood cultures, CXR and UA.   Virgina Jock, PA-C [facility] Office: [phone] Pager: [phone] 09/10/2015 2:34 PM   Addendum  Unclear etiology of recent evident.  Unclear if syncopal event but completely resolved at this point, without any focal neurologic symptoms.    - CT Head to verify no ICH  Adele Barthel, MD [facility] [place] Office: [phone] Pager: [phone]  09/10/2015, 2:58 PM

## 2015-09-10 NOTE — Progress Notes (Signed)
Inpatient Diabetes Program Recommendations  AACE/ADA: New Consensus Statement on Inpatient Glycemic Control (2015)  Target Ranges:  Prepandial:   less than 140 mg/dL      Peak postprandial:   less than 180 mg/dL (1-2 hours)      Critically ill patients:  140 - 180 mg/dL    Results for MILIANI, LOCHHEAD (MRN [id number]) as of 09/10/2015 14:19  Ref. Range 09/10/2015 08:59 09/10/2015 12:58  Glucose-Capillary Latest Ref Range: 65-99 mg/dL 173 (H) 233 (H)    Home DM Meds: Levemir 30 units QHS       Novolog 30 units daily       Metformin 1000 mg bid   Current Insulin Orders: Levemir 30 units QHS      Novolog Moderate SSI (0-15 units) TID AC       Metformin 1000 mg bid      MD- Patient having elevated postprandial glucose levels-  Please consider starting Novolog Meal Coverage- Novolog 3 units tidwc     --Will follow patient during hospitalization--  Wyn Quaker RN, MSN, CDE Diabetes Coordinator Inpatient Glycemic Control Team Team Pager: [phone] (8a-5p)

## 2015-09-10 NOTE — Progress Notes (Signed)
Pt up in chair with legs elevated.

## 2015-09-10 NOTE — Care Management Note (Signed)
Case Management Note  Patient Details  Name: Traci Raymond MRN: [id number] Date of Birth: September 19, 1950  Subjective/Objective:     Date: 09/10/15 Spoke with patient at the bedside.  Introduced self as Tourist information centre manager and explained role in discharge planning and how to be reached.  Verified patient lives in town, with two granddaughters who are 39 something years old and they help her out at night when aide is not there, she has an aide from 8 am to 6 pm every day. Has a cane at home. Expressed potential need for a rolling walker.  Verified patient anticipates to go home with family,  at time of discharge and will have full-time supervision by family at this time to best of their knowledge.  Patient denied needing help with their medication.  Patient  is driven by grand daughters or sister to MD appointments.  Verified patient has PCP Elisabeth Pigeon. Await pt/ot eval.    Plan: CM will continue to follow for discharge planning and [facility] resources.                Action/Plan:   Expected Discharge Date:                  Expected Discharge Plan:  [place]  In-House Referral:     Discharge planning Services  CM Consult  Post Acute Care Choice:    Choice offered to:     DME Arranged:    DME Agency:     HH Arranged:    [place] Agency:     Status of Service:  In process, will continue to follow  Medicare Important Message Given:    Date Medicare IM Given:    Medicare IM give by:    Date Additional Medicare IM Given:    Additional Medicare Important Message give by:     If discussed at [place] of Stay Meetings, dates discussed:    Additional Comments:  Zenon Mayo, RN 09/10/2015, 11:04 AM

## 2015-09-10 NOTE — Progress Notes (Signed)
RN called into the room to check on a questionable mental status change per PT. Pt was confused unaware of time, place, situation, by oriented to self. Percocet was provided ten minutes prior to this episode. Os sats dropped into the 70's, but rebounded quickly after oxygen was placed on her. Temp spiked to 100.4 at this time as well. Notified PA. Both PA and MD came to bedside to assess. New orders placed. Will continue to monitor.

## 2015-09-10 NOTE — Evaluation (Signed)
Occupational Therapy Evaluation Patient Details Name: Traci Raymond MRN: [id number] DOB: 01-23-1951 Today's Date: 09/10/2015    History of Present Illness Pt is a 65 yo female admitted for R fem pop bypass and R second toe amputation.     Clinical Impression   Pt admitted with the above diagnosis and has the deficits listed below. Pt would benefit from cont OT in acute care only to ensure she leaves at mod I level with adls so burden of care is reduced on her grandchildren.  Pt is very motivated and states she will have 24/7 S at home if necessary.      Follow Up Recommendations  No OT follow up;Supervision/Assistance - 24 hour    Equipment Recommendations  Tub/shower bench;Other (comment) (if not covered, pt will acquire a shower chair bench somehow)    Recommendations for Other Services       Precautions / Restrictions Precautions Precautions: Fall Restrictions Weight Bearing Restrictions: Yes RLE Weight Bearing: Partial weight bearing RLE Partial Weight Bearing Percentage or Pounds: through the heel only      Mobility Bed Mobility               General bed mobility comments: Pt in chair on arrival.  Transfers Overall transfer level: Needs assistance Equipment used: Rolling walker (2 wheeled) Transfers: Sit to/from Stand Sit to Stand: Min guard         General transfer comment: cues for hand placement on walker for safety.    Balance Overall balance assessment: Needs assistance Sitting-balance support: Bilateral upper extremity supported;Feet supported Sitting balance-Leahy Scale: Good     Standing balance support: Bilateral upper extremity supported;During functional activity Standing balance-Leahy Scale: Fair Standing balance comment: Pt did let go of walker at times and did well weight bearing through the heel.  Pt did need cues for walker safety.                            ADL Overall ADL's : Needs  assistance/impaired Eating/Feeding: Independent;Sitting   Grooming: Supervision/safety;Wash/dry face;Wash/dry hands;Oral care;Standing Grooming Details (indicate cue type and reason): cues to stand closer to the sink.  Pt tends to leave walker far away from her and then walk a few more steps w/o it. Upper Body Bathing: Set up;Sitting   Lower Body Bathing: Supervison/ safety;Sit to/from stand   Upper Body Dressing : Set up;Sitting   Lower Body Dressing: Supervision/safety;Sit to/from stand;Cueing for safety   Toilet Transfer: Supervision/safety;Ambulation;Comfort height toilet;Grab bars;RW Toilet Transfer Details (indicate cue type and reason): Pt walked to bathroom with S and walker . Toileting- Clothing Manipulation and Hygiene: Supervision/safety;Sit to/from stand       Functional mobility during ADLs: Supervision/safety;Cueing for safety;Rolling walker General ADL Comments: Pt did very well with adls. pt required cues for walker safety.       Vision Vision Assessment?: No apparent visual deficits   Perception     Praxis      Pertinent Vitals/Pain Pain Assessment: No/denies pain     Hand Dominance Right   Extremity/Trunk Assessment Upper Extremity Assessment Upper Extremity Assessment: Overall WFL for tasks assessed   Lower Extremity Assessment Lower Extremity Assessment: Defer to PT evaluation   Cervical / Trunk Assessment Cervical / Trunk Assessment: Normal   Communication Communication Communication: No difficulties   Cognition Arousal/Alertness: Awake/alert Behavior During Therapy: WFL for tasks assessed/performed Overall Cognitive Status: Within Functional Limits for tasks assessed  General Comments       Exercises       Shoulder Instructions      Home Living Family/patient expects to be discharged to:: Private residence Living Arrangements: Other relatives Available Help at Discharge: Available 24 hours/day Type of  Home: House Home Access: Stairs to enter [employer] of Steps: 2 Entrance Stairs-Rails: None Home Layout: One level     Bathroom Shower/Tub: Tub/shower unit;Curtain Shower/tub characteristics: Architectural technologist: Handicapped height     Home Equipment: [place] - single point   Additional Comments: needs walker and tub bench      Prior Functioning/Environment Level of Independence: Independent with assistive device(s)             OT Diagnosis: Generalized weakness   OT Problem List: Impaired balance (sitting and/or standing);Decreased knowledge of use of DME or AE;Decreased strength   OT Treatment/Interventions: Self-care/ADL training;DME and/or AE instruction;Therapeutic activities    OT Goals(Current goals can be found in the care plan section) Acute Rehab OT Goals Patient Stated Goal: to go home and be strong again. OT Goal Formulation: With patient Time For Goal Achievement: 09/24/15 Potential to Achieve Goals: Good ADL Goals Pt Will Perform Lower Body Dressing: with modified independence;sit to/from stand Pt Will Perform Tub/Shower Transfer: Tub transfer;with supervision;ambulating;tub bench;rolling walker Additional ADL Goal #1: Pt will walk to bathroom and toilet with mod I on high commode.  OT Frequency: Min 2X/week   Barriers to D/C:            Co-evaluation              End of Session Equipment Utilized During Treatment: Surveyor, mining Communication: Mobility status  Activity Tolerance: Patient tolerated treatment well Patient left: in chair;with call bell/phone within reach   Time: 1000-1026 OT Time Calculation (min): 26 min Charges:  OT General Charges $OT Visit: 1 Procedure OT Evaluation $OT Eval Moderate Complexity: 1 Procedure OT Treatments $Self Care/Home Management : 8-22 mins G-Codes:    Glenford Peers Sep 20, 2015, 10:35 AM  [phone]

## 2015-09-11 ENCOUNTER — Encounter (HOSPITAL_COMMUNITY): Payer: Medicare Other

## 2015-09-11 LAB — CBC
HEMATOCRIT: 33.1 % — AB (ref 36.0–46.0)
HEMOGLOBIN: 10.5 g/dL — AB (ref 12.0–15.0)
MCH: 25.4 pg — AB (ref 26.0–34.0)
MCHC: 31.7 g/dL (ref 30.0–36.0)
MCV: 80 fL (ref 78.0–100.0)
Platelets: 198 10*3/uL (ref 150–400)
RBC: 4.14 MIL/uL (ref 3.87–5.11)
RDW: 16.6 % — ABNORMAL HIGH (ref 11.5–15.5)
WBC: 13.7 10*3/uL — ABNORMAL HIGH (ref 4.0–10.5)

## 2015-09-11 LAB — URINALYSIS, ROUTINE W REFLEX MICROSCOPIC
Bilirubin Urine: NEGATIVE
GLUCOSE, UA: 100 mg/dL — AB
Ketones, ur: NEGATIVE mg/dL
Leukocytes, UA: NEGATIVE
Nitrite: NEGATIVE
Protein, ur: 100 mg/dL — AB
SPECIFIC GRAVITY, URINE: 1.011 (ref 1.005–1.030)
pH: 6 (ref 5.0–8.0)

## 2015-09-11 LAB — BASIC METABOLIC PANEL
Anion gap: 10 (ref 5–15)
BUN: 7 mg/dL (ref 6–20)
CHLORIDE: 103 mmol/L (ref 101–111)
CO2: 25 mmol/L (ref 22–32)
Calcium: 8.8 mg/dL — ABNORMAL LOW (ref 8.9–10.3)
Creatinine, Ser: 1.24 mg/dL — ABNORMAL HIGH (ref 0.44–1.00)
GFR calc Af Amer: 52 mL/min — ABNORMAL LOW (ref >60)
GFR calc non Af Amer: 45 mL/min — ABNORMAL LOW (ref >60)
GLUCOSE: 177 mg/dL — AB (ref 65–99)
POTASSIUM: 3.4 mmol/L — AB (ref 3.5–5.1)
Sodium: 138 mmol/L (ref 135–145)

## 2015-09-11 LAB — GLUCOSE, CAPILLARY
GLUCOSE-CAPILLARY: 171 mg/dL — AB (ref 65–99)
GLUCOSE-CAPILLARY: 220 mg/dL — AB (ref 65–99)
GLUCOSE-CAPILLARY: 135 mg/dL — AB (ref 65–99)
Glucose-Capillary: 125 mg/dL — ABNORMAL HIGH (ref 65–99)

## 2015-09-11 LAB — URINE MICROSCOPIC-ADD ON: WBC, UA: NONE SEEN WBC/hpf (ref 0–5)

## 2015-09-11 MED ORDER — TRAMADOL HCL 50 MG PO TABS: 50.0000 mg | ORAL_TABLET | Freq: Four times a day (QID) | ORAL | Status: DC | PRN

## 2015-09-11 MED ORDER — WHITE PETROLATUM GEL
Status: AC
  Administered 2015-09-11: 06:00:00
  Filled 2015-09-11: qty 1

## 2015-09-11 NOTE — Progress Notes (Addendum)
   Daily Progress Note  Assessment/Planning: POD #2 s/p R CFA To BK pop BPG w/ Propaten   Good augmentation of ABI after bypass  No further neuro events overnight  CXR non-diagnostic, repeat UA-, BC x 2 nothing so far  Suspect ATX as etiology of low grade temp  Pt needs to ambulate  Keep dressing to R groin as pt at high risk of complication given obesity  Transfer to floor  Home in the next 2-3 days depending on her increasing mobility  Subjective  - 2 Days Post-Op  No complaints, no further neuro events overnight  Objective Filed Vitals:   09/10/15 2200 09/10/15 2311 09/11/15 0329 09/11/15 0700  BP: 135/44 157/61 140/54   Pulse: 89 86 84   Temp:  98.9 F (37.2 C) 98.7 F (37.1 C) 99 F (37.2 C)  TempSrc:  Oral Oral Oral  Resp: 26 17 19    Height:      Weight:      SpO2: 90% 97% 96%     Intake/Output Summary (Last 24 hours) at 09/11/15 0857 Last data filed at 09/11/15 [id number]  Gross per 24 hour  Intake    480 ml  Output   1475 ml  Net   -995 ml   GEN  A&O x 3 PULM  CTAB CV  RRR GI  soft, NTND VASC  R groin c/d/i, dressing somewhat moist, calf inc c/d/i, biphasic L PT  Laboratory CBC    Component Value Date/Time   WBC 13.7* 09/11/2015 0458   HGB 10.5* 09/11/2015 0458   HCT 33.1* 09/11/2015 0458   PLT 198 09/11/2015 0458    BMET    Component Value Date/Time   NA 138 09/11/2015 0458   K 3.4* 09/11/2015 0458   CL 103 09/11/2015 0458   CO2 25 09/11/2015 0458   GLUCOSE 177* 09/11/2015 0458   BUN 7 09/11/2015 0458   CREATININE 1.24* 09/11/2015 0458   CALCIUM 8.8* 09/11/2015 0458   GFRNONAA 45* 09/11/2015 0458   GFRAA 52* 09/11/2015 [place], MD [facility]: [phone] Pager: [phone]  09/11/2015, 8:57 AM

## 2015-09-11 NOTE — Care Management Important Message (Signed)
Important Message  Patient Details  Name: Traci Raymond MRN: [id number] Date of Birth: 1950-12-20   Medicare Important Message Given:  Yes    [name] 09/11/2015, 11:31 AM

## 2015-09-11 NOTE — Progress Notes (Signed)
Inpatient Diabetes Program Recommendations  AACE/ADA: New Consensus Statement on Inpatient Glycemic Control (2015)  Target Ranges:  Prepandial:   less than 140 mg/dL      Peak postprandial:   less than 180 mg/dL (1-2 hours)      Critically ill patients:  140 - 180 mg/dL   Results for ALEC, DIPERNA (MRN [id number]) as of 09/11/2015 11:42  Ref. Range 09/10/2015 08:49 09/10/2015 08:59 09/10/2015 12:58 09/10/2015 13:43 09/10/2015 17:05 09/10/2015 21:37  Glucose-Capillary Latest Ref Range: 65-99 mg/dL 181 (H) 173 (H) 233 (H) 205 (H) 175 (H) 149 (H)   Results for CORNELLIA, WEHRLY (MRN [id number]) as of 09/11/2015 11:42  Ref. Range 09/11/2015 08:25 09/11/2015 11:26  Glucose-Capillary Latest Ref Range: 65-99 mg/dL 171 (H) 220 (H)    Home DM Meds: Levemir 30 units QHS  Novolog 30 units daily  Metformin 1000 mg bid   Current Insulin Orders: Levemir 30 units QHS  Novolog Moderate SSI (0-15 units) TID AC   Metformin 1000 mg bid    MD- Patient having elevated postprandial glucose levels-  Please consider starting Novolog Meal Coverage- Novolog 3 units tidwc  Please also consider increasing Levemir slightly to 32 units QHS    --Will follow patient during hospitalization--  Wyn Quaker RN, MSN, CDE Diabetes Coordinator Inpatient Glycemic Control Team Team Pager: [phone] (8a-5p)

## 2015-09-11 NOTE — Evaluation (Signed)
Physical Therapy Evaluation Patient Details Name: Traci Raymond MRN: [id number] DOB: 23-May-1951 Today's Date: 09/11/2015   History of Present Illness  Pt is a 65 yo female admitted for R fem pop bypass and R second toe amputation.    Clinical Impression  Pt admitted with/for fem-pop BPG and amputation 2nd R toe.  Pt currently limited functionally due to the problems listed below.  (see problems list.)  Pt will benefit from PT to maximize function and safety to be able to get home safely with available assist of family.     Follow Up Recommendations No PT follow up;Supervision - Intermittent;Other (comment) (up to 24/7 assist/supervision initially)    Equipment Recommendations  Rolling walker with 5" wheels    Recommendations for Other Services       Precautions / Restrictions Precautions Precautions: Fall Restrictions Weight Bearing Restrictions: Yes RLE Weight Bearing: Partial weight bearing RLE Partial Weight Bearing Percentage or Pounds: through the heel only      Mobility  Bed Mobility               General bed mobility comments: Pt in chair on arrival.  Transfers Overall transfer level: Needs assistance Equipment used: Rolling walker (2 wheeled) Transfers: Sit to/from Stand Sit to Stand: Supervision         General transfer comment: good technique  Ambulation/Gait Ambulation/Gait assistance: Supervision Ambulation Distance (Feet): 150 Feet Assistive device: Rolling walker (2 wheeled) Gait Pattern/deviations: Step-to pattern     General Gait Details: generally steady.  Not staying  off toes enough, but pt has no pain to cue her  Stairs            Wheelchair Mobility    Modified Rankin (Stroke Patients Only)       Balance Overall balance assessment: Needs assistance   Sitting balance-Leahy Scale: Good     Standing balance support: No upper extremity supported Standing balance-Leahy Scale: Fair                                Pertinent Vitals/Pain Pain Assessment: 0-10 Pain Score: 4  Faces Pain Scale: Hurts a little bit Pain Location: R leg Pain Descriptors / Indicators: Aching;Discomfort Pain Intervention(s): Monitored during session    Home Living Family/patient expects to be discharged to:: Private residence Living Arrangements: Other relatives Available Help at Discharge:  (up to 24 hours /day) Type of Home: House Home Access: Stairs to enter Entrance Stairs-Rails: None Entrance Stairs-Number of Steps: 2 Home Layout: One level Home Equipment: Cane - single point      Prior Function Level of Independence: Independent with assistive device(s)               Hand Dominance   Dominant Hand: Right    Extremity/Trunk Assessment               Lower Extremity Assessment: Overall WFL for tasks assessed (proximal weakness)      Cervical / Trunk Assessment: Normal  Communication   Communication: No difficulties  Cognition Arousal/Alertness: Awake/alert Behavior During Therapy: WFL for tasks assessed/performed Overall Cognitive Status: Within Functional Limits for tasks assessed                      General Comments      Exercises        Assessment/Plan    PT Assessment Patient needs continued PT services  PT Diagnosis Abnormality of gait;Generalized weakness  PT Problem List Decreased strength;Decreased activity tolerance;Decreased mobility;Decreased knowledge of use of DME;Decreased knowledge of precautions;Pain  PT Treatment Interventions DME instruction;Stair training;Gait training;Functional mobility training;Therapeutic activities;Patient/family education   PT Goals (Current goals can be found in the Care Plan section) Acute Rehab PT Goals Patient Stated Goal: to go home and be strong again. PT Goal Formulation: With patient Time For Goal Achievement: 09/18/15 Potential to Achieve Goals: Good    Frequency Min 3X/week   Barriers to discharge         Co-evaluation               End of Session   Activity Tolerance: Patient tolerated treatment well Patient left: in chair;with call bell/phone within reach Nurse Communication: Mobility status         Time: [id number] PT Time Calculation (min) (ACUTE ONLY): 18 min   Charges:   PT Evaluation $PT Eval Moderate Complexity: 1 Procedure     PT G Codes:        Harvey Matlack, Tessie Fass 09/11/2015, 5:34 PM    09/11/2015  Donnella Sham, PT [phone]  (pager)

## 2015-09-11 NOTE — Progress Notes (Signed)
Occupational Therapy Treatment Patient Details Name: Traci Raymond MRN: [id number] DOB: 1951-02-19 Today's Date: 09/11/2015    History of present illness Pt is a 65 yo female admitted for R fem pop bypass and R second toe amputation.     OT comments  Pt eager to demonstrate how well she is moving and ability to care for herself.  3 family members in room and will assist at home. Pt overall at a supervision to modified independent level with RW. Demonstrated ability to don and doff post op shoe.    Follow Up Recommendations  No OT follow up;Supervision/Assistance - 24 hour    Equipment Recommendations  Tub/shower bench (pt with Medicaid)    Recommendations for Other Services      Precautions / Restrictions Precautions Precautions: Fall Restrictions Weight Bearing Restrictions: Yes RLE Weight Bearing: Partial weight bearing RLE Partial Weight Bearing Percentage or Pounds: through the heel only       Mobility Bed Mobility               General bed mobility comments: Pt in chair on arrival.  Transfers Overall transfer level: Needs assistance Equipment used: Rolling walker (2 wheeled)   Sit to Stand: Supervision         General transfer comment: good technique    Balance     Sitting balance-Leahy Scale: Good       Standing balance-Leahy Scale: Fair                     ADL       Grooming: Supervision/safety;Wash/dry hands;Standing Grooming Details (indicate cue type and reason): cues for aligning RW with sink             Lower Body Dressing: Sit to/from stand;Set up   Toilet Transfer: Ambulation;Comfort height toilet;Grab bars;RW;Modified Independent   Toileting- Clothing Manipulation and Hygiene: Modified independent;Sit to/from stand       Functional mobility during ADLs: Supervision/safety;Rolling walker General ADL Comments: pt able to don and doff post op shoe      Vision                     Perception      Praxis      Cognition   Behavior During Therapy: [facility] for tasks assessed/performed Overall Cognitive Status: Within Functional Limits for tasks assessed                       Extremity/Trunk Assessment               Exercises     Shoulder Instructions       General Comments      Pertinent Vitals/ Pain       Pain Assessment: Faces Faces Pain Scale: Hurts a little bit Pain Location: R groin Pain Descriptors / Indicators: Sore Pain Intervention(s): Monitored during session  Home Living                                          Prior Functioning/Environment              Frequency Min 2X/week     Progress Toward Goals  OT Goals(current goals can now be found in the care plan section)  Progress towards OT goals: Progressing toward goals  Acute Rehab OT Goals Patient Stated Goal: to go home and be  strong again.  Plan Discharge plan remains appropriate    Co-evaluation                 End of Session Equipment Utilized During Treatment: Rolling walker   Activity Tolerance Patient tolerated treatment well   Patient Left in chair;with call bell/phone within reach;with family/visitor present   Nurse Communication          Time: 1400-1415 OT Time Calculation (min): 15 min  Charges: OT General Charges $OT Visit: 1 Procedure OT Treatments $Self Care/Home Management : 8-22 mins  Malka So 09/11/2015, 3:04 PM  [phone]

## 2015-09-11 NOTE — Progress Notes (Signed)
Patient to transfer to 2W06 report given to receiving nurse, all questions answered at this time.  Pt. VSS with no s/s of distress noted.  Patient stable at transfer.

## 2015-09-11 NOTE — Progress Notes (Signed)
     Right foot dressing changed at bed side. Second toe amp site clean and dry, min. Erythema on dorsum of foot with min. Edema  She is ambulating in the halls with a hard sole shoe heel weight bearring.   S/P R CFA To BK pop BPG w/ Propaten Right second toe amputation   Mylia Pondexter MAUREEN PA-C

## 2015-09-12 ENCOUNTER — Inpatient Hospital Stay (HOSPITAL_COMMUNITY): Payer: Medicare Other

## 2015-09-12 DIAGNOSIS — R4182 Altered mental status, unspecified: Secondary | ICD-10-CM

## 2015-09-12 LAB — GLUCOSE, CAPILLARY
GLUCOSE-CAPILLARY: 113 mg/dL — AB (ref 65–99)
GLUCOSE-CAPILLARY: 106 mg/dL — AB (ref 65–99)

## 2015-09-12 NOTE — Progress Notes (Signed)
[facility]  Subjective  - wants to go home   Objective 126/44 72 97.8 F (36.6 C) (Oral) 18 100%  Intake/Output Summary (Last 24 hours) at 09/12/15 1031 Last data filed at 09/12/15 0807  Gross per 24 hour  Intake    480 ml  Output   1000 ml  Net   -520 ml   Left and right leg incisions healing Feet warm bilaterally  Toe amp site healing  Assessment/Planning: D/c home today  Traci Raymond 09/12/2015 10:31 AM --  Laboratory Lab Results:  Recent Labs  09/10/15 0435 09/11/15 0458  WBC 12.6* 13.7*  HGB 10.7* 10.5*  HCT 34.0* 33.1*  PLT 210 198   BMET  Recent Labs  09/10/15 0435 09/11/15 0458  NA 140 138  K 3.9 3.4*  CL 102 103  CO2 28 25  GLUCOSE 225* 177*  BUN 9 7  CREATININE 1.42* 1.24*  CALCIUM 8.5* 8.8*    COAG Lab Results  Component Value Date   INR 1.07 09/09/2015   No results found for: PTT

## 2015-09-12 NOTE — Progress Notes (Signed)
VASCULAR LAB PRELIMINARY  PRELIMINARY  PRELIMINARY  PRELIMINARY  Carotid duplex  completed.    Preliminary report:  Bilateral:  1-39% ICA stenosis.  Vertebral artery flow is antegrade.      Tessah Patchen, RVT 09/12/2015, 11:47 AM

## 2015-09-12 NOTE — Care Management (Signed)
Case Management Note Initial Note Started By [name] 09-10-15 1104 Patient Details  Name: Traci Raymond MRN: [id number] Date of Birth: 1951/04/06  Subjective/Objective: Date: 09/10/15 Spoke with patient at the bedside.  Introduced self as Tourist information centre manager and explained role in discharge planning and how to be reached.  Verified patient lives in town, with two granddaughters who are 19 something years old and they help her out at night when aide is not there, she has an aide from 8 am to 6 pm every day. Has a cane at home. Expressed potential need for a rolling walker.  Verified patient anticipates to go home with family, at time of discharge and will have full-time supervision by family at this time to best of their knowledge.  Patient denied needing help with their medication.  Patient is driven by grand daughters or sister to MD appointments.  Verified patient has PCP Elisabeth Pigeon. Await pt/ot eval.   Plan: CM will continue to follow for discharge planning and [facility] resources.    Action/Plan:   Expected Discharge Date:     Expected Discharge Plan: [place]  In-House Referral:    Discharge planning Services CM Consult  Post Acute Care Choice: Durable Medical Equipment  Choice offered to:  Patient  DME Arranged:[facility]ile   DME Agency:  [place] Arranged:  N/A HH Agency:  N/A  Status of Service: Completed  Medicare Important Message Given:   Date Medicare IM Given:   Medicare IM give by:   Date Additional Medicare IM Given:   Additional Medicare Important Message give by:    If discussed at [place] of Stay Meetings, dates discussed:   Additional Comments: 1209 09-12-15 Jacqlyn Krauss, RN,BSN [phone] CM did ofer choice for DME- pt chose [facility]- Referral made and DME to be delivered to room before d/c. No further needs from CM at this time.

## 2015-09-12 NOTE — Progress Notes (Signed)
Discharge instructions goven and patient verbalized understanding.

## 2015-09-14 NOTE — Discharge Summary (Signed)
Physician Discharge Summary  Patient ID: Traci Raymond MRN: [id number] DOB/AGE: [birth year] 65 y.o.  Admit date: 09/09/2015 Discharge date: 09/12/15  Admission Diagnosis: Right second toe gangrene  Discharge Diagnoses:  same as above  Secondary Diagnoses: Past Medical History  Diagnosis Date  . Cancer ([place])     Uterian  . Diabetes mellitus without complication ([place])   . Hypertension   . Hyperlipidemia     Controlled by Rx  . Peripheral vascular disease ([place])     Procedures: 09/09/15: 1.  R CFA to BK popliteal bypass with Propaten 2.  R 2nd toe amputation  Discharged Condition: good  Hospital Course:  Traci Raymond is a 65 y.o. (January 15, 1951) female is a patient that presented with exposed distal phalange in her right second toe.  Her angiographic studies suggested she needed a: R CFA to BK popliteal bypass with vein if available.  She was scheduled for such on 09/09/15.  Intra-operatively, both inflow and target vessels were calcified.  Her left greater saphenous vein was found to be inadequate on exploration and the right GSV was found on vein mapping to be too small for use as a conduit.  Subsequently, the femoropopliteal bypass was completed with Propaten.  At the end of the case, I turned my attention to the right 2nd toe.  This was amputated with good bleeding at the wound base.  The rest of this patient's admission was only remarkable for a night of low grade temperatures.  None of the infectious work-up was diagnostic.  The low grade temperature was felt to be related to atelectasis.  Her post-op ABI demonstrated significant augmentation, 0.49 to 0.74.  The rest of her admission was unremarkable and she was discharged on 09/12/15.  Consults:  PT OT  Significant Diagnostic Studies:  BLE ABI:    RIGHT    LEFT    PRESSURE WAVEFORM  PRESSURE WAVEFORM  BRACHIAL 197 Triphasic BRACHIAL 213 Triphasic  DP 143 Dampened monophasic DP 114 Monophasic  AT    AT    PT 158 Monophasic PT 48 Monophasic  PER   PER    GREAT TOE Not obtained NA GREAT TOE Not obtained NA    RIGHT LEFT  ABI 0.74 0.54        CBC    Component Value Date/Time   WBC 13.7* 09/11/2015 0458   RBC 4.14 09/11/2015 0458   HGB 10.5* 09/11/2015 0458   HCT 33.1* 09/11/2015 0458   PLT 198 09/11/2015 0458   MCV 80.0 09/11/2015 0458   MCH 25.4* 09/11/2015 0458   MCHC 31.7 09/11/2015 0458   RDW 16.6* 09/11/2015 0458    BMET    Component Value Date/Time   NA 138 09/11/2015 0458   K 3.4* 09/11/2015 0458   CL 103 09/11/2015 0458   CO2 25 09/11/2015 0458   GLUCOSE 177* 09/11/2015 0458   BUN 7 09/11/2015 0458   CREATININE 1.24* 09/11/2015 0458   CALCIUM 8.8* 09/11/2015 0458   GFRNONAA 45* 09/11/2015 0458   GFRAA 52* 09/11/2015 0458    COAG Lab Results  Component Value Date   INR 1.07 09/09/2015   No results found for: PTT  Disposition: 01-Home or Self Care  Discharge Instructions    Call MD for:  redness, tenderness, or signs of infection (pain, swelling, bleeding, redness, odor or green/yellow discharge around incision site)    Complete by:  As directed      Call MD for:  severe or increased pain, loss or decreased feeling  in affected limb(s)    Complete by:  As directed      Call MD for:  temperature >100.5    Complete by:  As directed      Discharge instructions    Complete by:  As directed   You may shower daily.  Dry incision areas well.  Dry dressing changes to right foot daily.  Walk in a hard sole shoe with heel weight bearing.     Driving Restrictions    Complete by:  As directed   No driving until your right foot is healed.     Resume previous diet    Complete by:  As directed             Medication List    TAKE these medications        aspirin EC 81 MG tablet  Take 81 mg by mouth daily.     cholecalciferol 1000 units tablet  Commonly known as:  VITAMIN D  Take 1,000 Units by mouth daily.      clopidogrel 75 MG tablet  Commonly known as:  PLAVIX  Take 75 mg by mouth daily.     ferrous sulfate 325 (65 FE) MG tablet  Take 325 mg by mouth daily with breakfast.     LEVEMIR FLEXTOUCH 100 UNIT/ML Pen  Generic drug:  Insulin Detemir  Inject 30 Units into the skin daily at 10 pm.     lisinopril 10 MG tablet  Commonly known as:  PRINIVIL,ZESTRIL  Take 10 mg by mouth daily.     metFORMIN 1000 MG tablet  Commonly known as:  GLUCOPHAGE  Take 1,000 mg by mouth 2 (two) times daily with a meal.     NOVOLOG FLEXPEN 100 UNIT/ML FlexPen  Generic drug:  insulin aspart  Inject 30 Units into the skin daily.     omega-3 acid ethyl esters 1 g capsule  Commonly known as:  LOVAZA     Omega-3 Fish Oil 1200 MG Caps  Take 1,200 mg by mouth daily.     potassium chloride 10 MEQ tablet  Commonly known as:  K-DUR  Take 10 mEq by mouth daily.     rosuvastatin 40 MG tablet  Commonly known as:  CRESTOR  Take 40 mg by mouth daily.     traMADol 50 MG tablet  Commonly known as:  ULTRAM  Take 1 tablet (50 mg total) by mouth every 6 (six) hours as needed for moderate pain.         Signed: Adele Barthel 09/14/2015, 12:03 PM

## 2015-09-15 LAB — CULTURE, BLOOD (ROUTINE X 2)
CULTURE: NO GROWTH
Culture: NO GROWTH

## 2015-09-23 ENCOUNTER — Encounter: Payer: Self-pay | Admitting: Vascular Surgery

## 2015-09-23 ENCOUNTER — Other Ambulatory Visit: Payer: Self-pay

## 2015-09-23 ENCOUNTER — Ambulatory Visit: Payer: Medicare Other | Admitting: Vascular Surgery

## 2015-09-23 ENCOUNTER — Ambulatory Visit (INDEPENDENT_AMBULATORY_CARE_PROVIDER_SITE_OTHER): Payer: Medicare Other | Admitting: Vascular Surgery

## 2015-09-23 VITALS — BP 162/87 | HR 66 | Temp 98.8°F | Resp 18 | Ht 62.0 in | Wt 189.0 lb

## 2015-09-23 DIAGNOSIS — I70261 Atherosclerosis of native arteries of extremities with gangrene, right leg: Secondary | ICD-10-CM

## 2015-09-23 MED ORDER — CEPHALEXIN 500 MG PO CAPS: 500.0000 mg | ORAL_CAPSULE | Freq: Three times a day (TID) | ORAL | Status: DC

## 2015-09-23 NOTE — Progress Notes (Signed)
    Postoperative Visit   History of Present Illness  Traci Raymond is a 65 y.o. year old female who presents for postoperative follow-up for: R CFA to BK pop BPG w/ Propaten, R 2nd toe amp (Date: 09/09/15).  The patient notes improvement of of lower extremity symptoms.  The patient is able to complete their activities of daily living.  The patient's current symptoms are: clear drainage from right groin.  Denies any drainage from R 2nd toe amputation incisions  For VQI Use Only  PRE-ADM LIVING: Home  AMB STATUS: Ambulatory  Physical Examination  Filed Vitals:   09/23/15 1121 09/23/15 1124  BP: 175/76 162/87  Pulse: 66 66  Temp: 98.8 F (37.1 C)   Resp: 18    RLE: Rt groin has superficial separation with serous drainage, some skin necrosis with viable fat, skin resected, R 2nd toe amp incision c/d/i, edema 1+, BK pop exposure incision c/d/i  LLE: inc x 2 c/d/i   Medical Decision Making  Traci Raymond is a 65 y.o. year old female who presents s/p R CFA to BKA pop BPG w/ Propaten, R 2nd toe amp.   Right groin incision debrided.  Not to surprise given patient's obesity with pannus overlying incision.  Home health wound care: VAC dressing M-W-F  Rest of incision include toe amp look good.  Follow in 2 weeks  Thank you for allowing [place] to participate in this patient's care.  Adele Barthel, MD [facility] [place] Office: [phone] Pager: [phone]  09/23/2015, 12:25 PM

## 2015-09-25 ENCOUNTER — Other Ambulatory Visit: Payer: Self-pay

## 2015-09-25 DIAGNOSIS — S31109S Unspecified open wound of abdominal wall, unspecified quadrant without penetration into peritoneal cavity, sequela: Secondary | ICD-10-CM

## 2015-10-01 ENCOUNTER — Encounter: Payer: Self-pay | Admitting: Vascular Surgery

## 2015-10-08 ENCOUNTER — Encounter: Payer: Self-pay | Admitting: Vascular Surgery

## 2015-10-08 NOTE — Progress Notes (Signed)
    Postoperative Visit   History of Present Illness  Traci Raymond is a 65 y.o. (09-20-1950) female who presents for postoperative follow-up for: R CFA to BK pop BPG w/ Propaten, R 2nd toe amp (Date: 09/09/15). The patient notes improvement of of lower extremity symptoms. The patient is able to complete their activities of daily living. The patient's current symptoms are: continued VAC to R groin.  For VQI Use Only  PRE-ADM LIVING: Home  AMB STATUS: Ambulatory  Physical Examination  Filed Vitals:   10/09/15 0945 10/09/15 0947  BP: 177/80 167/81  Pulse: 83 83  Temp: 97.6 F (36.4 C)   Resp: 16   Height: 5\' 2"  (1.575 m)   Weight: 194 lb (87.998 kg)   SpO2: 97%     RLE: Rt groin: shallow, good granulation, ~3-5 mm depth still, no drainage or smell; R 2nd toe amp: healed, some sclerotic scar; BK pop inc c/d/i, some thinning in mid-segment  LLE: inc x 2 healed   Medical Decision Making  Traci Raymond is a 65 y.o. (12/25/1950) female  who presents s/p R CFA to BKA pop BPG w/ Propaten, R 2nd toe amp.   Home health wound care: VAC dressing M-W-F  Follow in 1 weeks.  Probably change to wet-to-dry dressing next.  Thank you for allowing [place] to participate in this patient's care.   Adele Barthel, MD, FACS [facility] [place] Office: [phone] Pager: [phone]  10/08/2015, 10:47 PM

## 2015-10-09 ENCOUNTER — Encounter: Payer: Self-pay | Admitting: Vascular Surgery

## 2015-10-09 ENCOUNTER — Ambulatory Visit (INDEPENDENT_AMBULATORY_CARE_PROVIDER_SITE_OTHER): Payer: Medicare Other | Admitting: Vascular Surgery

## 2015-10-09 VITALS — BP 167/81 | HR 83 | Temp 97.6°F | Resp 16 | Ht 62.0 in | Wt 194.0 lb

## 2015-10-09 DIAGNOSIS — I70261 Atherosclerosis of native arteries of extremities with gangrene, right leg: Secondary | ICD-10-CM

## 2015-10-09 NOTE — Progress Notes (Signed)
Filed Vitals:   10/09/15 0945 10/09/15 0947  BP: 177/80 167/81  Pulse: 83 83  Temp: 97.6 F (36.4 C)   Resp: 16   Height: 5\' 2"  (1.575 m)   Weight: 194 lb (87.998 kg)   SpO2: 97%

## 2015-10-09 NOTE — Progress Notes (Signed)
I  spoke with [facility] at [facility], [phone]   Per Dr. Princella Pellegrini. Chen's orders: Continue Wound Vac to Right Groin X's one week. 10/09/15  11:31am

## 2015-10-12 ENCOUNTER — Encounter: Payer: Self-pay | Admitting: Vascular Surgery

## 2015-10-13 ENCOUNTER — Encounter: Payer: Self-pay | Admitting: Vascular Surgery

## 2015-10-16 ENCOUNTER — Inpatient Hospital Stay (HOSPITAL_COMMUNITY)
Admission: AD | Admit: 2015-10-16 | Discharge: 2015-10-19 | DRG: 863 | Disposition: A | Discharge status: Home / Self Care | Payer: Medicare Other | Source: Ambulatory Visit | Attending: Vascular Surgery | Admitting: Vascular Surgery

## 2015-10-16 ENCOUNTER — Encounter: Payer: Self-pay | Admitting: Vascular Surgery

## 2015-10-16 ENCOUNTER — Ambulatory Visit (INDEPENDENT_AMBULATORY_CARE_PROVIDER_SITE_OTHER): Payer: Medicare Other | Admitting: Vascular Surgery

## 2015-10-16 VITALS — BP 123/62 | HR 73 | Temp 97.0°F | Resp 18 | Ht 62.0 in | Wt 194.0 lb

## 2015-10-16 DIAGNOSIS — L02415 Cutaneous abscess of right lower limb: Secondary | ICD-10-CM | POA: Diagnosis present

## 2015-10-16 DIAGNOSIS — Z419 Encounter for procedure for purposes other than remedying health state, unspecified: Secondary | ICD-10-CM

## 2015-10-16 DIAGNOSIS — I97648 Postprocedural seroma of a circulatory system organ or structure following other circulatory system procedure: Secondary | ICD-10-CM | POA: Diagnosis present

## 2015-10-16 DIAGNOSIS — E785 Hyperlipidemia, unspecified: Secondary | ICD-10-CM | POA: Diagnosis present

## 2015-10-16 DIAGNOSIS — I10 Essential (primary) hypertension: Secondary | ICD-10-CM | POA: Diagnosis present

## 2015-10-16 DIAGNOSIS — T814XXA Infection following a procedure, initial encounter: Principal | ICD-10-CM | POA: Diagnosis present

## 2015-10-16 DIAGNOSIS — Z89421 Acquired absence of other right toe(s): Secondary | ICD-10-CM

## 2015-10-16 DIAGNOSIS — F17210 Nicotine dependence, cigarettes, uncomplicated: Secondary | ICD-10-CM | POA: Diagnosis present

## 2015-10-16 DIAGNOSIS — Y832 Surgical operation with anastomosis, bypass or graft as the cause of abnormal reaction of the patient, or of later complication, without mention of misadventure at the time of the procedure: Secondary | ICD-10-CM | POA: Diagnosis present

## 2015-10-16 DIAGNOSIS — I70261 Atherosclerosis of native arteries of extremities with gangrene, right leg: Secondary | ICD-10-CM

## 2015-10-16 DIAGNOSIS — Z794 Long term (current) use of insulin: Secondary | ICD-10-CM

## 2015-10-16 DIAGNOSIS — Z95828 Presence of other vascular implants and grafts: Secondary | ICD-10-CM

## 2015-10-16 DIAGNOSIS — T819XXA Unspecified complication of procedure, initial encounter: Secondary | ICD-10-CM | POA: Diagnosis present

## 2015-10-16 DIAGNOSIS — E1151 Type 2 diabetes mellitus with diabetic peripheral angiopathy without gangrene: Secondary | ICD-10-CM | POA: Diagnosis present

## 2015-10-16 DIAGNOSIS — I9789 Other postprocedural complications and disorders of the circulatory system, not elsewhere classified: Secondary | ICD-10-CM | POA: Diagnosis not present

## 2015-10-16 LAB — COMPREHENSIVE METABOLIC PANEL
ALBUMIN: 2.9 g/dL — AB (ref 3.5–5.0)
ALT: 9 U/L — ABNORMAL LOW (ref 14–54)
ANION GAP: 8 (ref 5–15)
AST: 12 U/L — ABNORMAL LOW (ref 15–41)
Alkaline Phosphatase: 88 U/L (ref 38–126)
BILIRUBIN TOTAL: 0.1 mg/dL — AB (ref 0.3–1.2)
BUN: 8 mg/dL (ref 6–20)
CO2: 29 mmol/L (ref 22–32)
Calcium: 9.6 mg/dL (ref 8.9–10.3)
Chloride: 103 mmol/L (ref 101–111)
Creatinine, Ser: 1.35 mg/dL — ABNORMAL HIGH (ref 0.44–1.00)
GFR, EST AFRICAN AMERICAN: 47 mL/min — AB (ref >60)
GFR, EST NON AFRICAN AMERICAN: 41 mL/min — AB (ref >60)
GLUCOSE: 310 mg/dL — AB (ref 65–99)
POTASSIUM: 3.8 mmol/L (ref 3.5–5.1)
Sodium: 140 mmol/L (ref 135–145)
TOTAL PROTEIN: 6.4 g/dL — AB (ref 6.5–8.1)

## 2015-10-16 LAB — CBC
HEMATOCRIT: 31.9 % — AB (ref 36.0–46.0)
Hemoglobin: 9.7 g/dL — ABNORMAL LOW (ref 12.0–15.0)
MCH: 24.3 pg — ABNORMAL LOW (ref 26.0–34.0)
MCHC: 30.4 g/dL (ref 30.0–36.0)
MCV: 79.9 fL (ref 78.0–100.0)
Platelets: 295 10*3/uL (ref 150–400)
RBC: 3.99 MIL/uL (ref 3.87–5.11)
RDW: 17 % — AB (ref 11.5–15.5)
WBC: 11.3 10*3/uL — ABNORMAL HIGH (ref 4.0–10.5)

## 2015-10-16 LAB — PROTIME-INR
INR: 1.09 (ref 0.00–1.49)
PROTHROMBIN TIME: 14.3 s (ref 11.6–15.2)

## 2015-10-16 MED ORDER — HYDRALAZINE HCL 20 MG/ML IJ SOLN
5.0000 mg | INTRAMUSCULAR | Status: DC | PRN
  Administered 2015-10-18: 5 mg via INTRAVENOUS
  Filled 2015-10-16: qty 1

## 2015-10-16 MED ORDER — BISACODYL 10 MG RE SUPP: 10.0000 mg | Freq: Every day | RECTAL | Status: DC | PRN

## 2015-10-16 MED ORDER — LISINOPRIL 10 MG PO TABS
10.0000 mg | ORAL_TABLET | Freq: Every morning | ORAL | Status: DC
  Administered 2015-10-17 – 2015-10-19 (×3): 10 mg via ORAL
  Filled 2015-10-16 (×3): qty 1

## 2015-10-16 MED ORDER — INSULIN ASPART 100 UNIT/ML ~~LOC~~ SOLN
0.0000 [IU] | Freq: Three times a day (TID) | SUBCUTANEOUS | Status: DC
  Administered 2015-10-17: 7 [IU] via SUBCUTANEOUS
  Administered 2015-10-17 – 2015-10-19 (×5): 2 [IU] via SUBCUTANEOUS

## 2015-10-16 MED ORDER — GUAIFENESIN-DM 100-10 MG/5ML PO SYRP
15.0000 mL | ORAL_SOLUTION | ORAL | Status: DC | PRN
  Administered 2015-10-18: 15 mL via ORAL
  Filled 2015-10-16 (×2): qty 15

## 2015-10-16 MED ORDER — OXYCODONE-ACETAMINOPHEN 5-325 MG PO TABS: 1.0000 | ORAL_TABLET | ORAL | Status: DC | PRN

## 2015-10-16 MED ORDER — POTASSIUM CHLORIDE CRYS ER 20 MEQ PO TBCR: 20.0000 meq | EXTENDED_RELEASE_TABLET | Freq: Once | ORAL | Status: DC

## 2015-10-16 MED ORDER — PHENOL 1.4 % MT LIQD: 1.0000 | OROMUCOSAL | Status: DC | PRN

## 2015-10-16 MED ORDER — ACETAMINOPHEN 650 MG RE SUPP: 325.0000 mg | RECTAL | Status: DC | PRN

## 2015-10-16 MED ORDER — DOCUSATE SODIUM 100 MG PO CAPS
100.0000 mg | ORAL_CAPSULE | Freq: Two times a day (BID) | ORAL | Status: DC
  Administered 2015-10-17 – 2015-10-19 (×4): 100 mg via ORAL
  Filled 2015-10-16 (×5): qty 1

## 2015-10-16 MED ORDER — CLOPIDOGREL BISULFATE 75 MG PO TABS
75.0000 mg | ORAL_TABLET | Freq: Every morning | ORAL | Status: DC
  Administered 2015-10-17 – 2015-10-19 (×3): 75 mg via ORAL
  Filled 2015-10-16 (×3): qty 1

## 2015-10-16 MED ORDER — ROSUVASTATIN CALCIUM 10 MG PO TABS
40.0000 mg | ORAL_TABLET | Freq: Every day | ORAL | Status: DC
  Administered 2015-10-17 – 2015-10-18 (×2): 40 mg via ORAL
  Filled 2015-10-16 (×2): qty 4

## 2015-10-16 MED ORDER — ALUM & MAG HYDROXIDE-SIMETH 200-200-20 MG/5ML PO SUSP: 15.0000 mL | ORAL | Status: DC | PRN

## 2015-10-16 MED ORDER — SODIUM CHLORIDE 0.9 % IV SOLN
INTRAVENOUS | Status: DC
  Administered 2015-10-16: 21:00:00 via INTRAVENOUS

## 2015-10-16 MED ORDER — VITAMIN D 1000 UNITS PO TABS
1000.0000 [IU] | ORAL_TABLET | Freq: Every morning | ORAL | Status: DC
  Administered 2015-10-18 – 2015-10-19 (×2): 1000 [IU] via ORAL
  Filled 2015-10-16 (×3): qty 1

## 2015-10-16 MED ORDER — ASPIRIN EC 81 MG PO TBEC
81.0000 mg | DELAYED_RELEASE_TABLET | Freq: Every morning | ORAL | Status: DC
  Administered 2015-10-17 – 2015-10-19 (×3): 81 mg via ORAL
  Filled 2015-10-16 (×3): qty 1

## 2015-10-16 MED ORDER — ONDANSETRON HCL 4 MG/2ML IJ SOLN
4.0000 mg | Freq: Four times a day (QID) | INTRAMUSCULAR | Status: DC | PRN
Start: 2015-10-16 — End: 2015-10-19

## 2015-10-16 MED ORDER — MORPHINE SULFATE (PF) 2 MG/ML IV SOLN
2.0000 mg | INTRAVENOUS | Status: DC | PRN
  Administered 2015-10-18: 2 mg via INTRAVENOUS
  Filled 2015-10-16: qty 1

## 2015-10-16 MED ORDER — SENNOSIDES-DOCUSATE SODIUM 8.6-50 MG PO TABS: 1.0000 | ORAL_TABLET | Freq: Every evening | ORAL | Status: DC | PRN

## 2015-10-16 MED ORDER — CEFAZOLIN SODIUM 1-5 GM-% IV SOLN
1.0000 g | Freq: Three times a day (TID) | INTRAVENOUS | Status: DC
  Administered 2015-10-16 – 2015-10-19 (×8): 1 g via INTRAVENOUS
  Filled 2015-10-16 (×11): qty 50

## 2015-10-16 MED ORDER — OMEGA-3-ACID ETHYL ESTERS 1 G PO CAPS
1.0000 g | ORAL_CAPSULE | Freq: Every morning | ORAL | Status: DC
  Administered 2015-10-17 – 2015-10-19 (×3): 1 g via ORAL
  Filled 2015-10-16 (×3): qty 1

## 2015-10-16 MED ORDER — METFORMIN HCL 500 MG PO TABS
1000.0000 mg | ORAL_TABLET | Freq: Two times a day (BID) | ORAL | Status: DC
  Administered 2015-10-17 – 2015-10-19 (×4): 1000 mg via ORAL
  Filled 2015-10-16 (×4): qty 2

## 2015-10-16 MED ORDER — LABETALOL HCL 5 MG/ML IV SOLN: 10.0000 mg | INTRAVENOUS | Status: DC | PRN

## 2015-10-16 MED ORDER — ENOXAPARIN SODIUM 40 MG/0.4ML ~~LOC~~ SOLN
40.0000 mg | SUBCUTANEOUS | Status: DC
  Administered 2015-10-17 – 2015-10-19 (×3): 40 mg via SUBCUTANEOUS
  Filled 2015-10-16 (×4): qty 0.4

## 2015-10-16 MED ORDER — METOPROLOL TARTRATE 1 MG/ML IV SOLN: 2.0000 mg | INTRAVENOUS | Status: DC | PRN

## 2015-10-16 MED ORDER — FERROUS SULFATE 325 (65 FE) MG PO TABS
325.0000 mg | ORAL_TABLET | Freq: Every day | ORAL | Status: DC
Start: 2015-10-17 — End: 2015-10-19
  Administered 2015-10-18 – 2015-10-19 (×2): 325 mg via ORAL
  Filled 2015-10-16 (×3): qty 1

## 2015-10-16 MED ORDER — POTASSIUM CHLORIDE ER 10 MEQ PO TBCR
10.0000 meq | EXTENDED_RELEASE_TABLET | Freq: Every morning | ORAL | Status: DC
  Administered 2015-10-17 – 2015-10-19 (×3): 10 meq via ORAL
  Filled 2015-10-16 (×9): qty 1

## 2015-10-16 MED ORDER — PANTOPRAZOLE SODIUM 40 MG PO TBEC
40.0000 mg | DELAYED_RELEASE_TABLET | Freq: Every day | ORAL | Status: DC
  Administered 2015-10-16 – 2015-10-19 (×3): 40 mg via ORAL
  Filled 2015-10-16 (×4): qty 1

## 2015-10-16 MED ORDER — ACETAMINOPHEN 325 MG PO TABS: 325.0000 mg | ORAL_TABLET | ORAL | Status: DC | PRN

## 2015-10-16 MED ORDER — TRAMADOL HCL 50 MG PO TABS: 50.0000 mg | ORAL_TABLET | Freq: Four times a day (QID) | ORAL | Status: DC | PRN

## 2015-10-16 NOTE — H&P (Signed)
  [facility] [place]  History and Physical Update  The patient was interviewed and re-examined.  The patient's previous History and Physical has been reviewed and is unchanged from my consult earlier today.  Plan is to washout the right calf in the OR and place a negative pressure dressing if the wound is adequately clean.  Adele Barthel, MD [facility] [place] Office: [phone] Pager: [phone]  10/16/2015, 7:59 PM

## 2015-10-16 NOTE — Progress Notes (Signed)
Postoperative Visit   History of Present Illness  Traci Raymond is a 65 y.o. ([birth year]) female who presents for postoperative follow-up for: R CFA to BK pop BPG w/ Propaten, R 2nd toe amp (Date: 09/09/15). The patient notes improvement of of lower extremity symptoms. The patient is able to complete their activities of daily living. The patient's current symptoms are: drainage from right calf.  Past Medical History  Diagnosis Date  . Cancer ([place])     Uterian  . Diabetes mellitus without complication ([place])   . Hypertension   . Hyperlipidemia     Controlled by Rx  . Peripheral vascular disease [facility])     Past Surgical History  Procedure Laterality Date  . Abdominal hysterectomy    . Stents Bilateral     LE Right Leg one stent and Left has 2 stents  . Peripheral vascular catheterization N/A 08/27/2015    Procedure: Abdominal Aortogram;  Surgeon: Conrad Westmont, MD;  Location: [facility] CV LAB;  Service: Cardiovascular;  Laterality: N/A;  . Femoral-popliteal bypass graft Right 09/09/2015    Procedure: BYPASS GRAFT RIGHT COMMON FEMORAL-BELOW KNEE POPLITEAL ARTERY USING GORETEX PROPATEN 6MM X 80CM GRAFT;  Surgeon: Conrad Marbury, MD;  Location: [place];  Service: Vascular;  Laterality: Right;  . 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 Marital Status: Widowed    Spouse Name: N/A  . Number of Children: N/A  . Years of Education: N/A   Occupational History  . Not on file.   Social History Main Topics  . Smoking status: Current Some Day Smoker -- 0.50 packs/day    Types: Cigarettes  . Smokeless tobacco: Never Used  . Alcohol Use: No  . Drug Use: No     Comment: pt denies   . Sexual Activity: Not on file   Other Topics Concern  . Not on file   Social History Narrative    Family History  Problem Relation Age of  Onset  . Diabetes Mother   . Hypertension Mother   . Hyperlipidemia Mother   . Diabetes Father   . Hyperlipidemia Father   . Hypertension Father      Current Outpatient Prescriptions  Medication Sig Dispense Refill  . aspirin EC 81 MG tablet Take 81 mg by mouth daily.    . cholecalciferol (VITAMIN D) 1000 units tablet Take 1,000 Units by mouth daily.    . clopidogrel (PLAVIX) 75 MG tablet Take 75 mg by mouth daily.     . ferrous sulfate 325 (65 FE) MG tablet Take 325 mg by mouth daily with breakfast.    . LEVEMIR FLEXTOUCH 100 UNIT/ML Pen Inject 30 Units into the skin daily at 10 pm.     . lisinopril (PRINIVIL,ZESTRIL) 10 MG tablet Take 10 mg by mouth daily.     . metFORMIN (GLUCOPHAGE) 1000 MG tablet Take 1,000 mg by mouth 2 (two) times daily with a meal.     . omega-3 acid ethyl esters (LOVAZA) 1 g capsule     . Omega-3 Fatty Acids (OMEGA-3 FISH OIL) 1200 MG CAPS Take 1,200 mg by mouth daily.    . potassium chloride (K-DUR) 10 MEQ tablet Take 10 mEq by mouth daily.     . rosuvastatin (CRESTOR) 40 MG tablet Take 40 mg by mouth daily.     Marland Kitchen  cephALEXin (KEFLEX) 500 MG capsule Take 1 capsule (500 mg total) by mouth 3 (three) times daily. (Patient not taking: Reported on 10/09/2015) 21 capsule 0  . traMADol (ULTRAM) 50 MG tablet Take 1 tablet (50 mg total) by mouth every 6 (six) hours as needed for moderate pain. (Patient not taking: Reported on 09/23/2015) 30 tablet 0   No current facility-administered medications for this visit.     No Known Allergies   REVIEW OF SYSTEMS:  (Positives checked otherwise negative)  CARDIOVASCULAR:   [ ]  chest pain,  [ ]  chest pressure,  [ ]  palpitations,  [ ]  shortness of breath when laying flat,  [ ]  shortness of breath with exertion,   [ ]  pain in feet when walking,  [ ]  pain in feet when laying flat, [ ]  history of blood clot in veins (DVT),  [ ]  history of phlebitis,  [ ]  swelling in legs,  [ ]  varicose veins  PULMONARY:   [ ]  productive  cough,  [ ]  asthma,  [ ]  wheezing  NEUROLOGIC:   [ ]  weakness in arms or legs,  [ ]  numbness in arms or legs,  [ ]  difficulty speaking or slurred speech,  [ ]  temporary loss of vision in one eye,  [ ]  dizziness  HEMATOLOGIC:   [ ]  bleeding problems,  [ ]  problems with blood clotting too easily  MUSCULOSKEL:   [ ]  joint pain, [ ]  joint swelling  GASTROINTEST:   [ ]  vomiting blood,  [ ]  blood in stool     GENITOURINARY:   [ ]  burning with urination,  [ ]  blood in urine  PSYCHIATRIC:   [ ]  history of major depression  INTEGUMENTARY:   [ ]  rashes,  [ ]  ulcers  CONSTITUTIONAL:   [ ]  fever,  [ ]  chills   For VQI Use Only  PRE-ADM LIVING: Home  AMB STATUS: Ambulatory  Physical Examination Filed Vitals:   10/16/15 1012  BP: 123/62  Pulse: 73  Temp: 97 F (36.1 C)  Resp: 18  Height: 5\' 2"  (1.575 m)  Weight: 194 lb (87.998 kg)  SpO2: 97%   Pulmonary: Sym exp, good air movt, CTAB, no rales, rhonchi, & wheezing  Cardiac: RRR, Nl S1, S2, no Murmurs, rubs or gallops  RLE: Rt groin: good beef granulation; R 2nd toe amp: healed, some sclerotic scar; BK pop inc seropurulent drainage, incision opened at bedside with local anesthesia: some necrotic debris subcutaneous with no obvious graft exposed, wound cleaned up with just mechanical debridement  LLE: inc x 2 healed   Medical Decision Making  Traci Raymond is a 65 y.o. (Oct 16, 1950) female who presents s/p R CFA to BKA pop BPG w/ Propaten, R 2nd toe amp.   Unfortunately, abscess adjacent to distal anastomosis.  As a precaution, I want to bring her to hospital for wound cultures, blood cultures, IV abx, and wound care  If the wound continues to drain seropurulent fluid, may need to washout in OR.  Hopefully, will be transition back to a VAC at home but in short-term needs more aggressive intervention to salvage the graft.   Adele Barthel, MD, FACS [facility] [place] Office:  [phone] Pager: [phone]  10/16/2015, 12:45 PM

## 2015-10-17 ENCOUNTER — Inpatient Hospital Stay (HOSPITAL_COMMUNITY): Payer: Medicare Other | Admitting: Certified Registered Nurse Anesthetist

## 2015-10-17 ENCOUNTER — Inpatient Hospital Stay (HOSPITAL_COMMUNITY): Admission: RE | Admit: 2015-10-17 | Payer: Medicare Other | Source: Ambulatory Visit | Admitting: Vascular Surgery

## 2015-10-17 ENCOUNTER — Encounter (HOSPITAL_COMMUNITY)
Admission: AD | Disposition: A | Discharge status: Home / Self Care | Payer: Self-pay | Source: Ambulatory Visit | Attending: Vascular Surgery

## 2015-10-17 DIAGNOSIS — I9789 Other postprocedural complications and disorders of the circulatory system, not elsewhere classified: Secondary | ICD-10-CM

## 2015-10-17 HISTORY — PX: I & D EXTREMITY: SHX5045

## 2015-10-17 LAB — GLUCOSE, CAPILLARY
GLUCOSE-CAPILLARY: 306 mg/dL — AB (ref 65–99)
Glucose-Capillary: 176 mg/dL — ABNORMAL HIGH (ref 65–99)
Glucose-Capillary: 175 mg/dL — ABNORMAL HIGH (ref 65–99)
Glucose-Capillary: 165 mg/dL — ABNORMAL HIGH (ref 65–99)

## 2015-10-17 LAB — HEMOGLOBIN A1C
HEMOGLOBIN A1C: 9.7 % — AB (ref 4.8–5.6)
MEAN PLASMA GLUCOSE: 232 mg/dL

## 2015-10-17 SURGERY — IRRIGATION AND DEBRIDEMENT EXTREMITY
Anesthesia: General | Site: Leg Lower | Laterality: Right

## 2015-10-17 MED ORDER — 0.9 % SODIUM CHLORIDE (POUR BTL) OPTIME
TOPICAL | Status: DC | PRN
  Administered 2015-10-17: 1000 mL

## 2015-10-17 MED ORDER — PROPOFOL 10 MG/ML IV BOLUS
INTRAVENOUS | Status: DC | PRN
  Administered 2015-10-17: 200 mg via INTRAVENOUS

## 2015-10-17 MED ORDER — MIDAZOLAM HCL 5 MG/5ML IJ SOLN
INTRAMUSCULAR | Status: DC | PRN
  Administered 2015-10-17: 1 mg via INTRAVENOUS

## 2015-10-17 MED ORDER — OXYCODONE HCL 5 MG PO TABS: 5.0000 mg | ORAL_TABLET | Freq: Once | ORAL | Status: DC | PRN

## 2015-10-17 MED ORDER — SODIUM CHLORIDE 0.9 % IR SOLN
Status: DC | PRN
  Administered 2015-10-17: 1000 mL

## 2015-10-17 MED ORDER — ONDANSETRON HCL 4 MG/2ML IJ SOLN
INTRAMUSCULAR | Status: DC | PRN
  Administered 2015-10-17: 4 mg via INTRAVENOUS

## 2015-10-17 MED ORDER — FENTANYL CITRATE (PF) 100 MCG/2ML IJ SOLN
INTRAMUSCULAR | Status: DC | PRN
  Administered 2015-10-17: 50 ug via INTRAVENOUS
  Administered 2015-10-17 (×2): 25 ug via INTRAVENOUS

## 2015-10-17 MED ORDER — SODIUM CHLORIDE 0.9 % IR SOLN
Status: DC | PRN
  Administered 2015-10-17: 500 mL

## 2015-10-17 MED ORDER — PHENYLEPHRINE HCL 10 MG/ML IJ SOLN
INTRAMUSCULAR | Status: DC | PRN
  Administered 2015-10-17: 40 ug via INTRAVENOUS
  Administered 2015-10-17: 80 ug via INTRAVENOUS
  Administered 2015-10-17: 40 ug via INTRAVENOUS

## 2015-10-17 MED ORDER — LACTATED RINGERS IV SOLN
INTRAVENOUS | Status: DC
  Administered 2015-10-17 (×2): via INTRAVENOUS

## 2015-10-17 MED ORDER — FENTANYL CITRATE (PF) 100 MCG/2ML IJ SOLN: 25.0000 ug | INTRAMUSCULAR | Status: DC | PRN

## 2015-10-17 MED ORDER — LIDOCAINE HCL (CARDIAC) 20 MG/ML IV SOLN
INTRAVENOUS | Status: DC | PRN
  Administered 2015-10-17: 20 mg via INTRAVENOUS
  Administered 2015-10-17: 60 mg via INTRAVENOUS

## 2015-10-17 MED ORDER — PROMETHAZINE HCL 25 MG/ML IJ SOLN: 6.2500 mg | INTRAMUSCULAR | Status: DC | PRN

## 2015-10-17 MED ORDER — OXYCODONE HCL 5 MG/5ML PO SOLN: 5.0000 mg | Freq: Once | ORAL | Status: DC | PRN

## 2015-10-17 SURGICAL SUPPLY — 36 items
BANDAGE ELASTIC 4 VELCRO ST LF (GAUZE/BANDAGES/DRESSINGS) IMPLANT
BANDAGE ELASTIC 6 VELCRO ST LF (GAUZE/BANDAGES/DRESSINGS) IMPLANT
BNDG GAUZE ELAST 4 BULKY (GAUZE/BANDAGES/DRESSINGS) IMPLANT
CANISTER SUCTION 2500CC (MISCELLANEOUS) ×3 IMPLANT
CANISTER WOUND CARE 500ML ATS (WOUND CARE) ×3 IMPLANT
COVER SURGICAL LIGHT HANDLE (MISCELLANEOUS) ×6 IMPLANT
DRAPE INCISE IOBAN 66X45 STRL (DRAPES) IMPLANT
DRAPE ORTHO SPLIT 77X108 STRL (DRAPES) ×4
DRAPE PROXIMA HALF (DRAPES) ×3 IMPLANT
DRAPE SURG ORHT 6 SPLT 77X108 (DRAPES) ×2 IMPLANT
DRSG VAC ATS SM SENSATRAC (GAUZE/BANDAGES/DRESSINGS) ×3 IMPLANT
ELECT REM PT RETURN 9FT ADLT (ELECTROSURGICAL) ×3
ELECTRODE REM PT RTRN 9FT ADLT (ELECTROSURGICAL) ×1 IMPLANT
GAUZE SPONGE 4X4 12PLY STRL (GAUZE/BANDAGES/DRESSINGS) ×3 IMPLANT
GLOVE BIO SURGEON STRL SZ7 (GLOVE) ×3 IMPLANT
GLOVE BIOGEL PI IND STRL 6.5 (GLOVE) ×2 IMPLANT
GLOVE BIOGEL PI IND STRL 7.5 (GLOVE) ×1 IMPLANT
GLOVE BIOGEL PI INDICATOR 6.5 (GLOVE) ×4
GLOVE BIOGEL PI INDICATOR 7.5 (GLOVE) ×2
GLOVE SURG SS PI 7.5 STRL IVOR (GLOVE) ×3 IMPLANT
GOWN STRL REUS W/ TWL LRG LVL3 (GOWN DISPOSABLE) ×2 IMPLANT
GOWN STRL REUS W/TWL LRG LVL3 (GOWN DISPOSABLE) ×4
KIT BASIN OR (CUSTOM PROCEDURE TRAY) ×3 IMPLANT
KIT ROOM TURNOVER OR (KITS) ×3 IMPLANT
MANIFOLD NEPTUNE II (INSTRUMENTS) ×3 IMPLANT
NS IRRIG 1000ML POUR BTL (IV SOLUTION) ×3 IMPLANT
PACK GENERAL/GYN (CUSTOM PROCEDURE TRAY) ×3 IMPLANT
PAD ARMBOARD 7.5X6 YLW CONV (MISCELLANEOUS) ×6 IMPLANT
SPONGE GAUZE 4X4 12PLY STER LF (GAUZE/BANDAGES/DRESSINGS) ×3 IMPLANT
SUT ETHILON 3 0 PS 1 (SUTURE) IMPLANT
SUT MNCRL AB 4-0 PS2 18 (SUTURE) IMPLANT
SUT VIC AB 2-0 CTX 36 (SUTURE) IMPLANT
SUT VIC AB 3-0 SH 27 (SUTURE)
SUT VIC AB 3-0 SH 27X BRD (SUTURE) IMPLANT
TAPE CLOTH SURG 4X10 WHT LF (GAUZE/BANDAGES/DRESSINGS) ×3 IMPLANT
WATER STERILE IRR 1000ML POUR (IV SOLUTION) ×3 IMPLANT

## 2015-10-17 NOTE — Op Note (Signed)
    OPERATIVE NOTE   PROCEDURE: 1.  Right calf exploration and washout 2.  Placement of negative pressure dressing  PRE-OPERATIVE DIAGNOSIS: possible infected seroma right calf  POST-OPERATIVE DIAGNOSIS: same as above   SURGEON: Adele Barthel, MD  ANESTHESIA: general  ESTIMATED BLOOD LOSS: 30 cc  FINDING(S): 1.  No frank pus or infection 2.  Septated seroma 3.  No exposed graft  SPECIMEN(S):  Anaerobic and aerobic swab of right calf  INDICATIONS:   Traci Raymond is a 65 y.o. female s/p recent R fem-pop bypass with Propaten who presents with drainage from right calf.  I did a limited exploration in the clinic that raised concerns for deeper infection in right calf.  I subsequently admitted the patient to the hospital for IV antibiotics and exploration of the right calf, possible abscess incision and drainage, and possible VAC placement.  The patient is aware the risks include: bleeding, infection, and possible seeding of the bypass graft.  DESCRIPTION: After obtaining full informed written consent, the patient was brought back to the operating room and placed supine upon the operating table.  The patient received IV antibiotics prior to induction.  After obtaining adequate anesthesia, the patient was prepped and draped in the standard fashion for: right calf exploration.  I bluntly probed the right calf cavity.  I opened up the skin closure another 2 cm more proximally.  I controlled bleeding with electrocautery.  There was no obvious infection or pus in the wound.  There were some septated membranes consistent with seroma.  I took swabs of the right calf with anaerobic and aerobic culture tubes.  I washed out the right calf with a 1 L of sterile normal saline using a Pulsavac.  Then I irrigated antibiotic solution in the right calf (bactracin/polymyxin).   I sharply fashioned the VAC sponge for the wound and packed it into right calf.  I affixed the sponge with adhesive strips.  I  cut a hole in the adhesive strips and affixed a lilypad.  The VAC tubing was connected to the VAC pump at 125 continuous.  The VAC dressing became adherent.    I also changed the wet-to-dry dressing in right groin.  Sterile dressing were applied.    COMPLICATIONS: none  CONDITION: stable   Adele Barthel, MD [facility] [place] Office: [phone] Pager: [phone]  10/17/2015, 12:02 PM

## 2015-10-17 NOTE — H&P (View-Only) (Signed)
Postoperative Visit   History of Present Illness  Periann Barredo is a 65 y.o. (1951/05/31) female who presents for postoperative follow-up for: R CFA to BK pop BPG w/ Propaten, R 2nd toe amp (Date: 09/09/15). The patient notes improvement of of lower extremity symptoms. The patient is able to complete their activities of daily living. The patient's current symptoms are: drainage from right calf.  Past Medical History  Diagnosis Date  . Cancer ([place])     Uterian  . Diabetes mellitus without complication ([place])   . Hypertension   . Hyperlipidemia     Controlled by Rx  . Peripheral vascular disease [facility])     Past Surgical History  Procedure Laterality Date  . Abdominal hysterectomy    . Stents Bilateral     LE Right Leg one stent and Left has 2 stents  . Peripheral vascular catheterization N/A 08/27/2015    Procedure: Abdominal Aortogram;  Surgeon: Conrad Whitehall, MD;  Location: [place] CV LAB;  Service: Cardiovascular;  Laterality: N/A;  . Femoral-popliteal bypass graft Right 09/09/2015    Procedure: BYPASS GRAFT RIGHT COMMON FEMORAL-BELOW KNEE POPLITEAL ARTERY USING GORETEX PROPATEN 6MM X 80CM GRAFT;  Surgeon: Conrad Chester Hill, MD;  Location: [place];  Service: Vascular;  Laterality: Right;  . 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 Marital Status: Widowed    Spouse Name: N/A  . Number of Children: N/A  . Years of Education: N/A   Occupational History  . Not on file.   Social History Main Topics  . Smoking status: Current Some Day Smoker -- 0.50 packs/day    Types: Cigarettes  . Smokeless tobacco: Never Used  . Alcohol Use: No  . Drug Use: No     Comment: pt denies   . Sexual Activity: Not on file   Other Topics Concern  . Not on file   Social History Narrative    Family History  Problem Relation Age of  Onset  . Diabetes Mother   . Hypertension Mother   . Hyperlipidemia Mother   . Diabetes Father   . Hyperlipidemia Father   . Hypertension Father      Current Outpatient Prescriptions  Medication Sig Dispense Refill  . aspirin EC 81 MG tablet Take 81 mg by mouth daily.    . cholecalciferol (VITAMIN D) 1000 units tablet Take 1,000 Units by mouth daily.    . clopidogrel (PLAVIX) 75 MG tablet Take 75 mg by mouth daily.     . ferrous sulfate 325 (65 FE) MG tablet Take 325 mg by mouth daily with breakfast.    . LEVEMIR FLEXTOUCH 100 UNIT/ML Pen Inject 30 Units into the skin daily at 10 pm.     . lisinopril (PRINIVIL,ZESTRIL) 10 MG tablet Take 10 mg by mouth daily.     . metFORMIN (GLUCOPHAGE) 1000 MG tablet Take 1,000 mg by mouth 2 (two) times daily with a meal.     . omega-3 acid ethyl esters (LOVAZA) 1 g capsule     . Omega-3 Fatty Acids (OMEGA-3 FISH OIL) 1200 MG CAPS Take 1,200 mg by mouth daily.    . potassium chloride (K-DUR) 10 MEQ tablet Take 10 mEq by mouth daily.     . rosuvastatin (CRESTOR) 40 MG tablet Take 40 mg by mouth daily.     Marland Kitchen  cephALEXin (KEFLEX) 500 MG capsule Take 1 capsule (500 mg total) by mouth 3 (three) times daily. (Patient not taking: Reported on 10/09/2015) 21 capsule 0  . traMADol (ULTRAM) 50 MG tablet Take 1 tablet (50 mg total) by mouth every 6 (six) hours as needed for moderate pain. (Patient not taking: Reported on 09/23/2015) 30 tablet 0   No current facility-administered medications for this visit.     No Known Allergies   REVIEW OF SYSTEMS:  (Positives checked otherwise negative)  CARDIOVASCULAR:   [ ]  chest pain,  [ ]  chest pressure,  [ ]  palpitations,  [ ]  shortness of breath when laying flat,  [ ]  shortness of breath with exertion,   [ ]  pain in feet when walking,  [ ]  pain in feet when laying flat, [ ]  history of blood clot in veins (DVT),  [ ]  history of phlebitis,  [ ]  swelling in legs,  [ ]  varicose veins  PULMONARY:   [ ]  productive  cough,  [ ]  asthma,  [ ]  wheezing  NEUROLOGIC:   [ ]  weakness in arms or legs,  [ ]  numbness in arms or legs,  [ ]  difficulty speaking or slurred speech,  [ ]  temporary loss of vision in one eye,  [ ]  dizziness  HEMATOLOGIC:   [ ]  bleeding problems,  [ ]  problems with blood clotting too easily  MUSCULOSKEL:   [ ]  joint pain, [ ]  joint swelling  GASTROINTEST:   [ ]  vomiting blood,  [ ]  blood in stool     GENITOURINARY:   [ ]  burning with urination,  [ ]  blood in urine  PSYCHIATRIC:   [ ]  history of major depression  INTEGUMENTARY:   [ ]  rashes,  [ ]  ulcers  CONSTITUTIONAL:   [ ]  fever,  [ ]  chills   For VQI Use Only  PRE-ADM LIVING: Home  AMB STATUS: Ambulatory  Physical Examination Filed Vitals:   10/16/15 1012  BP: 123/62  Pulse: 73  Temp: 97 F (36.1 C)  Resp: 18  Height: 5\' 2"  (1.575 m)  Weight: 194 lb (87.998 kg)  SpO2: 97%   Pulmonary: Sym exp, good air movt, CTAB, no rales, rhonchi, & wheezing  Cardiac: RRR, Nl S1, S2, no Murmurs, rubs or gallops  RLE: Rt groin: good beef granulation; R 2nd toe amp: healed, some sclerotic scar; BK pop inc seropurulent drainage, incision opened at bedside with local anesthesia: some necrotic debris subcutaneous with no obvious graft exposed, wound cleaned up with just mechanical debridement  LLE: inc x 2 healed   Medical Decision Making  Farhiya Vannatter is a 65 y.o. (Jan 05, 1951) female who presents s/p R CFA to BKA pop BPG w/ Propaten, R 2nd toe amp.   Unfortunately, abscess adjacent to distal anastomosis.  As a precaution, I want to bring her to hospital for wound cultures, blood cultures, IV abx, and wound care  If the wound continues to drain seropurulent fluid, may need to washout in OR.  Hopefully, will be transition back to a VAC at home but in short-term needs more aggressive intervention to salvage the graft.   Adele Barthel, MD, FACS [facility] [place] Office:  [phone] Pager: [phone]  10/16/2015, 12:45 PM

## 2015-10-17 NOTE — Transfer of Care (Signed)
Immediate Anesthesia Transfer of Care Note  Patient: Traci Raymond  Procedure(s) Performed: Procedure(s): CALF WASHOUT; WOUND VAC APPLICATION (Right)  Patient Location: PACU  Anesthesia Type:General  Level of Consciousness: awake, alert  and oriented  Airway & Oxygen Therapy: Patient connected to face mask oxygen, spontaneous resp  Post-op Assessment: Report given to RN and Post -op Vital signs reviewed and stable  Post vital signs: Reviewed and stable  Last Vitals:  Filed Vitals:   10/17/15 0450 10/17/15 0901  BP: 145/56 154/49  Pulse: 73 72  Temp: 36.8 C 36.8 C  Resp: 18 18    Complications: No apparent anesthesia complications

## 2015-10-17 NOTE — Anesthesia Preprocedure Evaluation (Addendum)
Anesthesia Evaluation  Patient identified by MRN, date of birth, ID band Patient awake    Reviewed: Allergy & Precautions, NPO status , Patient's Chart, lab work & pertinent test results  Airway Mallampati: II  TM Distance: >3 FB Neck ROM: Full    Dental no notable dental hx. (+) Teeth Intact, Dental Advisory Given   Pulmonary Current Smoker,    Pulmonary exam normal breath sounds clear to auscultation       Cardiovascular hypertension, Pt. on medications + Peripheral Vascular Disease  Normal cardiovascular exam Rhythm:Regular Rate:Normal     Neuro/Psych negative neurological ROS  negative psych ROS   GI/Hepatic negative GI ROS, Neg liver ROS,   Endo/Other  diabetes  Renal/GU negative Renal ROS  negative genitourinary   Musculoskeletal negative musculoskeletal ROS (+)   Abdominal   Peds negative pediatric ROS (+)  Hematology negative hematology ROS (+)   Anesthesia Other Findings   Reproductive/Obstetrics negative OB ROS                            Anesthesia Physical Anesthesia Plan  ASA: III  Anesthesia Plan: General   Post-op Pain Management:    Induction: Intravenous  Airway Management Planned: LMA  Additional Equipment:   Intra-op Plan:   Post-operative Plan: Extubation in OR  Informed Consent: I have reviewed the patients History and Physical, chart, labs and discussed the procedure including the risks, benefits and alternatives for the proposed anesthesia with the patient or authorized representative who has indicated his/her understanding and acceptance.   Dental advisory given  Plan Discussed with: CRNA and Surgeon  Anesthesia Plan Comments:         Anesthesia Quick Evaluation

## 2015-10-17 NOTE — Anesthesia Postprocedure Evaluation (Signed)
Anesthesia Post Note  Patient: Traci Raymond  Procedure(s) Performed: Procedure(s) (LRB): CALF WASHOUT; WOUND VAC APPLICATION (Right)  Patient location during evaluation: PACU Anesthesia Type: General Level of consciousness: awake and alert Pain management: pain level controlled Vital Signs Assessment: post-procedure vital signs reviewed and stable Respiratory status: spontaneous breathing, nonlabored ventilation, respiratory function stable and patient connected to nasal cannula oxygen Cardiovascular status: blood pressure returned to baseline and stable Postop Assessment: no signs of nausea or vomiting Anesthetic complications: no    Last Vitals:  Filed Vitals:   10/17/15 0450 10/17/15 0901  BP: 145/56 154/49  Pulse: 73 72  Temp: 36.8 C 36.8 C  Resp: 18 18    Last Pain:  Filed Vitals:   10/17/15 0905  PainSc: 3                  Eman Morimoto S

## 2015-10-17 NOTE — Interval H&P Note (Signed)
History and Physical Interval Note:  10/17/2015 11:22 AM  Traci Raymond  has presented today for surgery, with the diagnosis of Right calf abscess L08.9  The various methods of treatment have been discussed with the patient and family. After consideration of risks, benefits and other options for treatment, the patient has consented to  Procedure(s): CALF WASHOUT; POSSIBLE INCISION AND DRAINAGE OF ABSCESS (Right) as a surgical intervention .  The patient's history has been reviewed, patient examined, no change in status, stable for surgery.  I have reviewed the patient's chart and labs.  Questions were answered to the patient's satisfaction.     Adele Barthel

## 2015-10-17 NOTE — Anesthesia Procedure Notes (Signed)
Procedure Name: LMA Insertion Date/Time: 10/17/2015 11:36 AM Performed by: Merdis Delay Pre-anesthesia Checklist: Patient identified, Emergency Drugs available, Suction available, Patient being monitored and Timeout performed Patient Re-evaluated:Patient Re-evaluated prior to inductionOxygen Delivery Method: Circle system utilized Preoxygenation: Pre-oxygenation with 100% oxygen Intubation Type: IV induction LMA: LMA inserted LMA Size: 4.0 Number of attempts: 1 Placement Confirmation: positive ETCO2,  CO2 detector and breath sounds checked- equal and bilateral Tube secured with: Tape Dental Injury: Teeth and Oropharynx as per pre-operative assessment

## 2015-10-18 ENCOUNTER — Encounter (HOSPITAL_COMMUNITY): Payer: Self-pay | Admitting: *Deleted

## 2015-10-18 LAB — GLUCOSE, CAPILLARY
GLUCOSE-CAPILLARY: 159 mg/dL — AB (ref 65–99)
GLUCOSE-CAPILLARY: 171 mg/dL — AB (ref 65–99)
GLUCOSE-CAPILLARY: 165 mg/dL — AB (ref 65–99)
GLUCOSE-CAPILLARY: 170 mg/dL — AB (ref 65–99)

## 2015-10-18 NOTE — Progress Notes (Addendum)
[facility] [place]  Subjective  - Doing well over all.  Sitting up at bed side eating full breakfast.   Objective 130/68 72 98.1 F (36.7 C) (Oral) 18 100%  Intake/Output Summary (Last 24 hours) at 10/18/15 0844 Last data filed at 10/17/15 1540  [place] per 24 hour  Intake   1040 ml  Output    350 ml  Net    690 ml    Right medial incision wound vac intact and functioning   Assessment/Planning: POD # 1  PROCEDURE: 1. Right calf exploration and washout 2. Placement of negative pressure dressing Pending intraoperative cultures Ancef IV q 8   COLLINS, EMMA MAUREEN 10/18/2015 8:44 AM --  Laboratory Lab Results:  Recent Labs  10/16/15 1922  WBC 11.3*  HGB 9.7*  HCT 31.9*  PLT 295   BMET  Recent Labs  10/16/15 1922  NA 140  K 3.8  CL 103  CO2 29  GLUCOSE 310*  BUN 8  CREATININE 1.35*  CALCIUM 9.6    COAG Lab Results  Component Value Date   INR 1.09 10/16/2015   INR 1.07 09/09/2015   No results found for: PTT    Addendum  I have independently interviewed and examined the patient, and I agree with the physician assistant's findings.  R groin incision clean with good granulation.  Setup patient for outpatient wound care:  - Hydrogel to right groin daily with sterile dressing on top. - VAC: M-W-F to right calf - D/C home once home wound care setup on Monday - Follow up in 2 weeks in the office  Adele Barthel, MD [facility] [place] Office: [phone] Pager: [phone]  10/18/2015, 9:48 AM

## 2015-10-18 NOTE — Progress Notes (Signed)
Telemetry called stating that pt had 7 episodes of V-tach. Pt asymptomatic sitting up in a chair watching television. Denies pain or discomfort. Md notified. Will continue to monitor.

## 2015-10-19 ENCOUNTER — Encounter (HOSPITAL_COMMUNITY): Payer: Self-pay | Admitting: Vascular Surgery

## 2015-10-19 LAB — GLUCOSE, CAPILLARY: GLUCOSE-CAPILLARY: 162 mg/dL — AB (ref 65–99)

## 2015-10-19 MED ORDER — OXYCODONE-ACETAMINOPHEN 5-325 MG PO TABS: 1.0000 | ORAL_TABLET | ORAL | Status: DC | PRN

## 2015-10-19 MED ORDER — CEPHALEXIN 500 MG PO CAPS: 500.0000 mg | ORAL_CAPSULE | Freq: Three times a day (TID) | ORAL | Status: DC

## 2015-10-19 NOTE — Progress Notes (Signed)
Pt has been discharged home. Pt's IV was removed with no complications. Pt received discharge instructions and all questions were answered. Home health is following up with pt about wound vac for right calf. Pt is aware of this. Pt left the floor via wheelchair and was accompanied by a friend and a Scientist, product/process development. Pt was in no distress at time of discharge.   Grant Fontana RN, BSN

## 2015-10-19 NOTE — Care Management Important Message (Signed)
Important Message  Patient Details  Name: Traci Raymond MRN: [id number] Date of Birth: Jan 17, 1951   Medicare Important Message Given:  Yes    [name] Abena 10/19/2015, 9:50 AM

## 2015-10-19 NOTE — Progress Notes (Addendum)
   Daily Progress Note  Assessment/Planning: POD #2 s/p R calf washout, VAC placement    Good granulation tissue on right calf: continue VAC: M-W-F at home  Good granulation in R groin: hydrogel dressing to R groin BID  Follow up in the office in 31 MAR 16  D/C home on Keflex 500 mg 1 po tid x 7 days    Subjective  - 2 Days Post-Op  Wants to go home  Objective Filed Vitals:   10/18/15 0553 10/18/15 0940 10/18/15 2008 10/18/15 2209  BP: 130/68 144/63 185/69 168/52  Pulse: 72 67 74   Temp: 98.1 F (36.7 C) 98.7 F (37.1 C) 98.3 F (36.8 C)   TempSrc: Oral Oral Oral Oral  Resp: 18 18    Height:      Weight:      SpO2: 100% 97% 100% 100%   No intake or output data in the 24 hours ending 10/19/15 0649  PULM  CTAB CV  RRR GI  soft, NTND VASC  R groin: clean, good granulation, shallow wound base; R calf: clean, good granulation, no graft in wound  Laboratory CBC    Component Value Date/Time   WBC 11.3* 10/16/2015 1922   HGB 9.7* 10/16/2015 1922   HCT 31.9* 10/16/2015 1922   PLT 295 10/16/2015 1922    BMET    Component Value Date/Time   NA 140 10/16/2015 1922   K 3.8 10/16/2015 1922   CL 103 10/16/2015 1922   CO2 29 10/16/2015 1922   GLUCOSE 310* 10/16/2015 1922   BUN 8 10/16/2015 1922   CREATININE 1.35* 10/16/2015 1922   CALCIUM 9.6 10/16/2015 1922   GFRNONAA 41* 10/16/2015 1922   GFRAA 47* 10/16/2015 1922    Adele Barthel, MD [facility] [place] Office: [phone] Pager: [phone]  10/19/2015, 6:49 AM

## 2015-10-19 NOTE — Care Management Note (Signed)
Case Management Note Marvetta Gibbons RN, BSN Unit 2W-Case Manager [phone]  Patient Details  Name: Traci Raymond MRN: [id number] Date of Birth: [birth year]  Subjective/Objective:   Pt admitted with infected right calf s/p I&D and wound VAC applied                 Action/Plan: PTA pt lived at home- has granddaughters that assist- and she has an aide from 8 am to 6 pm every day.- Pt was active with [facility] PTA for wound VAC in groin site- [facility]- spoke with Vaughan Basta at [facility] regarding current [facility] needs- and wound VAC will need to be applied to right calf site now- new orders faxed- per [facility] will try to get RN out to pt this evening vs first thing in AM to reapply wound VAC to calf site- per PA M. Collins this is ok for pt to go with w-d drsg in place. Spoke with Jermaine with [facility] and confirmed that pt has home wound VAC through [facility]- per pt the wound VAC is at home- all notes and orders for wound care and wound VAC have been faxed to Alianza at [phone]- pt aware of plan to resume HH and is agreeable.  New Wound VAC order signed and given to [facility] with [facility]  Expected Discharge Date:   10/19/15               Expected Discharge Plan:  [place]  In-House Referral:     Discharge planning Services  CM Consult  Post Acute Care Choice:  Home Health, Resumption of Svcs/PTA Provider, Durable Medical Equipment Choice offered to:  Patient  DME Arranged:  Vac DME Agency:  [place] Arranged:  RN [place] [facility] Agency:  [facility]  Status of Service:  Completed, signed off  Medicare Important Message Given:  Yes Date Medicare IM Given:    Medicare IM give by:    Date Additional Medicare IM Given:    Additional Medicare Important Message give by:     If discussed at [place] of Stay Meetings, dates discussed:    Discharge Disposition: home/home health   Additional Comments:  Dawayne Patricia,  RN 10/19/2015, 11:53 AM

## 2015-10-20 ENCOUNTER — Telehealth: Payer: Self-pay | Admitting: Vascular Surgery

## 2015-10-20 NOTE — Telephone Encounter (Signed)
-----   Message from Conrad [place], MD sent at 10/20/2015 10:20 AM EDT ----- Regarding: RE: schedule 24th  ----- Message -----    From: Gena Fray    Sent: 10/20/2015   8:30 AM      To: Conrad , MD Subject: Melton Alar: schedule                                   Dr Bridgett Larsson,  Please see Maureen's note below. Do you want to follow up with Ms Piquette on the 31st?  Thanks, Hinton Dyer ----- Message -----    From: Mena Goes, RN    Sent: 10/19/2015  12:35 PM      To: Loleta Rose Admin Pool Subject: schedule                                         ----- Message -----    From: Ulyses Amor, PA-C    Sent: 10/19/2015  11:58 AM      To: Vvs Charge Pool  This is what Dr. Bridgett Larsson put in his note "    Follow up in the office in 31 MAR 16"  Im not sure what date he wants f/u will you see if he called it in or what he wants he did not answer my text this am.  Thx [facility]

## 2015-10-20 NOTE — Telephone Encounter (Signed)
LM for pt re appt, dpm °

## 2015-10-21 LAB — WOUND CULTURE

## 2015-10-22 LAB — ANAEROBIC CULTURE

## 2015-10-22 NOTE — Discharge Summary (Signed)
Patient ID:  Traci Raymond MRN: [id number] DOB/AGE: 03-24-1951 65 y.o.  Admit date: 10/16/2015 Discharge date: 10/19/2015 Date of Surgery: 10/16/2015 - 10/17/2015 Surgeon: Juliann Mule): Conrad Monterey, MD  Admission Diagnosis: Abscess to right calf Right calf abscess L08.9  Discharge Diagnoses:  Abscess to right calf Right calf abscess L08.9  Secondary Diagnoses: Past Medical History  Diagnosis Date  . Cancer ([place])     Uterian  . Diabetes mellitus without complication ([place])   . Hypertension   . Hyperlipidemia     Controlled by Rx  . Peripheral vascular disease ([place])     Procedure(s): CALF WASHOUT; WOUND VAC APPLICATION  Discharged Condition: good  HPI: Traci Raymond is a 65 y.o. ([birth year]) female who presents for postoperative follow-up for: R CFA to BK pop BPG w/ Propaten, R 2nd toe amp (Date: 09/09/15). The patient notes improvement of of lower extremity symptoms. The patient is able to complete their activities of daily living. The patient's current symptoms are: drainage from right calf.    Hospital Course:  Traci Raymond is a 65 y.o. female is S/P  Procedure(s): PROCEDURE: 1. Right calf exploration and washout 2. Placement of negative pressure dressing   POD# 1    1. Right calf exploration and washout 2. Placement of negative pressure dressing Pending intraoperative cultures Ancef IV q 8  POD #2 s/p R calf washout, VAC placement   Good granulation tissue on right calf: continue VAC: M-W-F at home  Good granulation in R groin: hydrogel dressing to R groin BID  Follow up in the office in 31 MAR 16  D/C home on Keflex 500 mg 1 po tid x 7 days  Significant Diagnostic Studies: CBC Lab Results  Component Value Date   WBC 11.3* 10/16/2015   HGB 9.7* 10/16/2015   HCT 31.9* 10/16/2015   MCV 79.9 10/16/2015   PLT 295 10/16/2015    BMET    Component Value Date/Time   NA 140 10/16/2015 1922   K 3.8 10/16/2015 1922   CL 103  10/16/2015 1922   CO2 29 10/16/2015 1922   GLUCOSE 310* 10/16/2015 1922   BUN 8 10/16/2015 1922   CREATININE 1.35* 10/16/2015 1922   CALCIUM 9.6 10/16/2015 1922   GFRNONAA 41* 10/16/2015 1922   GFRAA 47* 10/16/2015 1922   COAG Lab Results  Component Value Date   INR 1.09 10/16/2015   INR 1.07 09/09/2015     Disposition:  Discharge to :Home Discharge Instructions    Call MD for:  redness, tenderness, or signs of infection (pain, swelling, bleeding, redness, odor or green/yellow discharge around incision site)    Complete by:  As directed      Call MD for:  severe or increased pain, loss or decreased feeling  in affected limb(s)    Complete by:  As directed      Call MD for:  temperature >100.5    Complete by:  As directed      Discharge instructions    Complete by:  As directed   Keep wound vac area clean and dry     Discharge patient    Complete by:  As directed   Discharge pt to home     Increase activity slowly    Complete by:  As directed   Walk with assistance use walker or cane as needed     Resume previous diet    Complete by:  As directed  Medication List    TAKE these medications        aspirin EC 81 MG tablet  Take 81 mg by mouth every morning.     cephALEXin 500 MG capsule  Commonly known as:  KEFLEX  Take 1 capsule (500 mg total) by mouth 3 (three) times daily.     cholecalciferol 1000 units tablet  Commonly known as:  VITAMIN D  Take 1,000 Units by mouth every morning.     clopidogrel 75 MG tablet  Commonly known as:  PLAVIX  Take 75 mg by mouth every morning.     ferrous sulfate 325 (65 FE) MG tablet  Take 325 mg by mouth daily with breakfast.     LEVEMIR FLEXTOUCH 100 UNIT/ML Pen  Generic drug:  Insulin Detemir  Inject 30 Units into the skin at bedtime.     lisinopril 10 MG tablet  Commonly known as:  PRINIVIL,ZESTRIL  Take 10 mg by mouth every morning.     metFORMIN 1000 MG tablet  Commonly known as:  GLUCOPHAGE  Take  1,000 mg by mouth 2 (two) times daily with a meal.     NOVOLOG FLEXPEN 100 UNIT/ML FlexPen  Generic drug:  insulin aspart  Inject 20 Units into the skin 2 (two) times daily with a meal.     omega-3 acid ethyl esters 1 g capsule  Commonly known as:  LOVAZA  Take 1 g by mouth every morning.     oxyCODONE-acetaminophen 5-325 MG tablet  Commonly known as:  PERCOCET/ROXICET  Take 1-2 tablets by mouth every 4 (four) hours as needed for moderate pain.     potassium chloride 10 MEQ tablet  Commonly known as:  K-DUR  Take 10 mEq by mouth every morning.     rosuvastatin 40 MG tablet  Commonly known as:  CRESTOR  Take 40 mg by mouth every morning.       Verbal and written Discharge instructions given to the patient. Wound care per Discharge AVS     Follow-up Information    Follow up with Adele Barthel, MD.   Specialties:  Vascular Surgery, Cardiology   Why:  Office will call you to arrange your appt (sent)   Contact information:   [place] [address] [phone]       Follow up with [facility].   Why:  HH-RN for wound care and Wound VAC drsg M/W/F   Contact information:   [place] [address] [phone]       Follow up with [facility].   Why:  wound VAC for home- (already has wound VAC at home to be applied to calf)   Contact information:   [address] [place] [address] [phone]       Signed: Laurence Slate [facility] 10/22/2015, 9:45 AM  Addendum  I have independently interviewed and examined the patient, and I agree with the physician assistant's discharge summary.  This patient had an infected seroma in her right calf.  I felt she needed incision and drainage of the abscess, washout and VAC placement.  She had this completed and then was discharge with a VAC for the right calf wound.  She will follow up in the office in 2 weeks.  Adele Barthel, MD [facility]  [place] Office: [phone] Pager: [phone]  10/22/2015, 9:52 AM

## 2015-10-26 ENCOUNTER — Encounter: Payer: Self-pay | Admitting: Vascular Surgery

## 2015-10-29 NOTE — Progress Notes (Signed)
    Postoperative Visit   History of Present Illness  Traci Raymond is a 65 y.o. year old female who presents for postoperative follow-up for:  1.  R CFA to BK pop BPG, R 2nd toe amp (Date: 09/09/15).   2.  R calf exploration and washout, VAC placement.  The patient's wounds are healing.  Home Health is doing VAC: M-W-F.  They also do R groin wet-to-dry dressings.  The patient notes resolution of lower extremity symptoms.  The patient is able to complete their activities of daily living.  The patient's current symptoms are: none.  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 year old female who presents s/p R CFA to BK pop BPG, R 2nd toe amp, R calf exploration and washout, VAC placement . The patient's bypass incisions are healing with wound care  Cont wound VAC and R groin wet-to-dry dressing.  Will see the patient one 19 APR 17 for re-evaluation of wounds.  I expect the R groin to be healed by then.  The calf wound should have contracted to the point of requiring wet-to-dry dressings by then  Thank you for allowing [place] to participate in this patient's care.  Adele Barthel, MD [facility] [place] Office: [phone] Pager: [phone]  10/30/2015, 2:22 PM

## 2015-10-30 ENCOUNTER — Encounter: Payer: Self-pay | Admitting: Vascular Surgery

## 2015-10-30 ENCOUNTER — Ambulatory Visit (INDEPENDENT_AMBULATORY_CARE_PROVIDER_SITE_OTHER): Payer: Medicare Other | Admitting: Vascular Surgery

## 2015-10-30 VITALS — BP 173/67 | HR 71 | Temp 98.1°F | Ht 62.0 in | Wt 195.5 lb

## 2015-10-30 DIAGNOSIS — I70261 Atherosclerosis of native arteries of extremities with gangrene, right leg: Secondary | ICD-10-CM

## 2015-11-18 ENCOUNTER — Encounter: Payer: Self-pay | Admitting: Vascular Surgery

## 2015-11-25 ENCOUNTER — Encounter: Payer: Self-pay | Admitting: Vascular Surgery

## 2015-11-25 ENCOUNTER — Ambulatory Visit (INDEPENDENT_AMBULATORY_CARE_PROVIDER_SITE_OTHER): Payer: Medicare Other | Admitting: Vascular Surgery

## 2015-11-25 VITALS — BP 160/64 | HR 70 | Temp 97.5°F | Resp 18 | Ht 62.0 in | Wt 196.0 lb

## 2015-11-25 DIAGNOSIS — Z48812 Encounter for surgical aftercare following surgery on the circulatory system: Secondary | ICD-10-CM

## 2015-11-25 DIAGNOSIS — I70261 Atherosclerosis of native arteries of extremities with gangrene, right leg: Secondary | ICD-10-CM

## 2015-11-25 NOTE — Progress Notes (Signed)
    Postoperative Visit   History of Present Illness  Traci Raymond is a 65 y.o. (06-05-1951) female  who presents for postoperative follow-up for:   1. R CFA to BK pop BPG, R 2nd toe amp (Date: 09/09/15).  2. R calf exploration and washout, VAC placement.  The patient's wounds are healed.  The patient notes resolution of lower extremity symptoms. The patient is able to complete their activities of daily living. The patient's current symptoms are: none.  For VQI Use Only  PRE-ADM LIVING: Home  AMB STATUS: Ambulatory  Physical Examination Filed Vitals:   11/25/15 1230  BP: 160/64  Pulse: 70  Temp: 97.5 F (36.4 C)  TempSrc: Oral  Resp: 18  Height: 5\' 2"  (1.575 m)  Weight: 196 lb (88.905 kg)  SpO2: 100%     LLE: right groin healed, right calf healed, somewhat attenuated skin superficially, dopplerable R PT and DP   Medical Decision Making  Traci Raymond is a 65 y.o. (14-Dec-1950) female who presents s/p R CFA to BK pop BPG, R 2nd toe amp, R calf exploration and washout, VAC placement .  The patient has healed.  Her surveillance studies are due for this coming May.  She will return with a R bypass duplex and BLE ABI.  Thank you for allowing [place] to participate in this patient's care.  Adele Barthel, MD, FACS [facility] [place] Office: [phone] Pager: [phone]  11/25/2015, 5:04 PM

## 2015-11-26 NOTE — Addendum Note (Signed)
Addended by: Mena Goes on: 11/26/2015 04:14 PM   Modules accepted: Orders

## 2015-12-11 ENCOUNTER — Encounter: Payer: Self-pay | Admitting: Vascular Surgery

## 2015-12-17 NOTE — Progress Notes (Signed)
Established Previous Bypass   History of Present Illness  Traci Raymond is a 65 y.o. 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  Previous operation(s) include:   1. R CFA to BK pop BPG, R 2nd toe amp (Date: 09/09/15).  2. R calf exploration and washout, VAC placement.(10/17/15)  The patient's symptoms have improved since surgery.  The patient's treatment regimen currently included: maximal medical management with Plavix, glucose monitory and daily aspirin.  She states she can walk 1/2 mile at a time.  However she has not stopped smoking.    The patient's PMH, PSH, SH, and FamHx are unchanged from 11/25/15.  Current Outpatient Prescriptions  Medication Sig Dispense Refill  . aspirin EC 81 MG tablet Take 81 mg by mouth every morning.     . cholecalciferol (VITAMIN D) 1000 units tablet Take 1,000 Units by mouth every morning.     . clopidogrel (PLAVIX) 75 MG tablet Take 75 mg by mouth every morning.     . ferrous sulfate 325 (65 FE) MG tablet Take 325 mg by mouth daily with breakfast.    . insulin aspart (NOVOLOG FLEXPEN) 100 UNIT/ML FlexPen Inject 20 Units into the skin 2 (two) times daily with a meal.    . LEVEMIR FLEXTOUCH 100 UNIT/ML Pen Inject 30 Units into the skin at bedtime.     Marland Kitchen lisinopril (PRINIVIL,ZESTRIL) 10 MG tablet Take 10 mg by mouth every morning.     . metFORMIN (GLUCOPHAGE) 1000 MG tablet Take 1,000 mg by mouth 2 (two) times daily with a meal.     . omega-3 acid ethyl esters (LOVAZA) 1 g capsule Take 1 g by mouth every morning.     Marland Kitchen oxyCODONE-acetaminophen (PERCOCET/ROXICET) 5-325 MG tablet Take 1-2 tablets by mouth every 4 (four) hours as needed for moderate pain. 30 tablet 0  . potassium chloride (K-DUR) 10 MEQ tablet Take 10 mEq by mouth every morning.     . rosuvastatin (CRESTOR) 40 MG tablet Take 40 mg by mouth every morning.      No current facility-administered medications for this visit.    No Known Allergies  On  ROS today: no open wounds, no fever or chills.   Physical Examination  Filed Vitals:   12/18/15 1344 12/18/15 1348  BP: 176/72 147/66  Pulse: 75   Height: 5\' 2"  (1.575 m)   Weight: 194 lb 11.2 oz (88.315 kg)   SpO2: 99%    Body mass index is 35.6 kg/(m^2).  General: A&O x 3, WD  Pulmonary: Sym exp, good air movt, CTAB, no rales, rhonchi,  + wheezing   Cardiac: RRR, Nl S1, S2, no Murmurs, rubs or gallops  Vascular: Vessel Right Left  Radial Palpable Palpable  Brachial Palpable Palpable  Carotid Palpable, without bruit Palpable, without bruit  Aorta Not palpable N/A  Femoral Palpable Palpable  Popliteal Not palpable Not palpable  PT Not Palpable notPalpable  DP notPalpable Not Palpable   Gastrointestinal: soft, NTND, no G/R, no HSM, no masses,   Musculoskeletal: M/S 5/5 throughout , Extremities without ischemic changes , incisions are healed with scab on medical BK incision.  No erythema minimal edema  Neurologic: Pain and light touch intact in extremities  Non-Invasive Vascular Imaging ABI (Date: 12/17/2015)  R:   ABI: 0.98 (triphasic),   DP: 0.84,   PT: 0.98,   TBI: 0.69  L:   ABI: 0.54 (biphasic),   DP: 0.57,   PT: 0.59,   TBI: 0.69  Bypass Duplex (Date: 12/17/2015)  Proximal to bypass: 95 c/s  With-in bypass: 66 c/s  Distal to bypass: 56 c/s  CFA 254 c/s  Triphasic throughout    Medical Decision Making  Traci Raymond is a 65 y.o. female who presents s/p R CFA to BK pop BPG, R 2nd toe amp, R calf exploration and washout, VAC placement   The bypass graft is patent and R 2nd toe amp site has healed.  Based on the patient's vascular studies and examination, I have offered the patient: BLE ABI  3 months. I discussed the importance of smoking cessation and starting a walking program.  She agreed to try.    COLLINS, EMMA MAUREEN PA-C The patient was seen in conjunction with Adele Barthel, MD [facility]: [phone]   12/17/2015, 10:25 PM  Addendum  I have independently interviewed and examined the patient, and I agree with the physician assistant's findings.  Follow up in 3 months with BLE ABI  Adele Barthel, MD [facility] [place] Office: [phone] Pager: [phone]  12/18/2015, 2:27 PM

## 2015-12-18 ENCOUNTER — Ambulatory Visit (INDEPENDENT_AMBULATORY_CARE_PROVIDER_SITE_OTHER): Payer: Medicare Other | Admitting: Vascular Surgery

## 2015-12-18 ENCOUNTER — Ambulatory Visit (INDEPENDENT_AMBULATORY_CARE_PROVIDER_SITE_OTHER)
Admission: RE | Admit: 2015-12-18 | Discharge: 2015-12-18 | Disposition: A | Discharge status: Home / Self Care | Payer: Medicare Other | Source: Ambulatory Visit | Attending: Vascular Surgery | Admitting: Vascular Surgery

## 2015-12-18 ENCOUNTER — Encounter: Payer: Self-pay | Admitting: Vascular Surgery

## 2015-12-18 ENCOUNTER — Ambulatory Visit (HOSPITAL_COMMUNITY)
Admission: RE | Admit: 2015-12-18 | Discharge: 2015-12-18 | Disposition: A | Discharge status: Home / Self Care | Payer: Medicare Other | Source: Ambulatory Visit | Attending: Vascular Surgery | Admitting: Vascular Surgery

## 2015-12-18 VITALS — BP 147/66 | HR 75 | Ht 62.0 in | Wt 194.7 lb

## 2015-12-18 DIAGNOSIS — R938 Abnormal findings on diagnostic imaging of other specified body structures: Secondary | ICD-10-CM | POA: Diagnosis not present

## 2015-12-18 DIAGNOSIS — R0989 Other specified symptoms and signs involving the circulatory and respiratory systems: Secondary | ICD-10-CM | POA: Diagnosis not present

## 2015-12-18 DIAGNOSIS — I70269 Atherosclerosis of native arteries of extremities with gangrene, unspecified extremity: Secondary | ICD-10-CM

## 2015-12-18 DIAGNOSIS — I739 Peripheral vascular disease, unspecified: Secondary | ICD-10-CM | POA: Diagnosis not present

## 2015-12-18 DIAGNOSIS — I10 Essential (primary) hypertension: Secondary | ICD-10-CM | POA: Insufficient documentation

## 2015-12-18 DIAGNOSIS — E119 Type 2 diabetes mellitus without complications: Secondary | ICD-10-CM | POA: Diagnosis not present

## 2015-12-18 DIAGNOSIS — E785 Hyperlipidemia, unspecified: Secondary | ICD-10-CM | POA: Diagnosis not present

## 2015-12-18 DIAGNOSIS — I70261 Atherosclerosis of native arteries of extremities with gangrene, right leg: Secondary | ICD-10-CM

## 2015-12-18 DIAGNOSIS — Z48812 Encounter for surgical aftercare following surgery on the circulatory system: Secondary | ICD-10-CM

## 2016-03-29 ENCOUNTER — Encounter: Payer: Self-pay | Admitting: Vascular Surgery

## 2016-03-30 ENCOUNTER — Other Ambulatory Visit: Payer: Self-pay | Admitting: *Deleted

## 2016-03-30 DIAGNOSIS — I739 Peripheral vascular disease, unspecified: Secondary | ICD-10-CM

## 2016-03-31 NOTE — Progress Notes (Deleted)
Established Previous Bypass   History of Present Illness  Traci Raymond is a 65 y.o. 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  Previous operation(s) include:   1. R CFA to BK pop BPG, R 2nd toe amp (Date: 09/09/15).  2. R calf exploration and washout, VAC placement.(10/17/15)  ***The patient's symptoms have improved since surgery.  The patient's treatment regimen currently included: maximal medical management with Plavix, glucose monitory and daily aspirin.  She states she can walk 1/2 mile at a time.  However she has not stopped smoking.    The patient's PMH, PSH, SH, and FamHx are unchanged from 12/17/15  Current Outpatient Prescriptions  Medication Sig Dispense Refill  . aspirin EC 81 MG tablet Take 81 mg by mouth every morning.     . cholecalciferol (VITAMIN D) 1000 units tablet Take 1,000 Units by mouth every morning.     . clopidogrel (PLAVIX) 75 MG tablet Take 75 mg by mouth every morning.     . ferrous sulfate 325 (65 FE) MG tablet Take 325 mg by mouth daily with breakfast.    . insulin aspart (NOVOLOG FLEXPEN) 100 UNIT/ML FlexPen Inject 20 Units into the skin 2 (two) times daily with a meal.    . LEVEMIR FLEXTOUCH 100 UNIT/ML Pen Inject 30 Units into the skin at bedtime.     Marland Kitchen lisinopril (PRINIVIL,ZESTRIL) 10 MG tablet Take 10 mg by mouth every morning.     . metFORMIN (GLUCOPHAGE) 1000 MG tablet Take 1,000 mg by mouth 2 (two) times daily with a meal.     . omega-3 acid ethyl esters (LOVAZA) 1 g capsule Take 1 g by mouth every morning.     Marland Kitchen oxyCODONE-acetaminophen (PERCOCET/ROXICET) 5-325 MG tablet Take 1-2 tablets by mouth every 4 (four) hours as needed for moderate pain. 30 tablet 0  . potassium chloride (K-DUR) 10 MEQ tablet Take 10 mEq by mouth every morning.     . rosuvastatin (CRESTOR) 40 MG tablet Take 40 mg by mouth every morning.      No current facility-administered medications for this visit.    On ROS today: no  open wounds, no fever or chills.   ***Physical Examination  There were no vitals filed for this visit.  There is no height or weight on file to calculate BMI.  General: A&O x 3, WD  Pulmonary: Sym exp, good air movt, CTAB, no rales, rhonchi,  + wheezing   Cardiac: RRR, Nl S1, S2, no Murmurs, rubs or gallops  Vascular: Vessel Right Left  Radial Palpable Palpable  Brachial Palpable Palpable  Carotid Palpable, without bruit Palpable, without bruit  Aorta Not palpable N/A  Femoral Palpable Palpable  Popliteal Not palpable Not palpable  PT Not Palpable NotPalpable  DP Not Palpable Not Palpable   Gastrointestinal: soft, NTND, no G/R, no HSM, no masses,   Musculoskeletal: M/S 5/5 throughout , Extremities without ischemic changes , incisions are healededema  Neurologic: Pain and light touch intact in extremities  Non-Invasive Vascular Imaging ABI (Date: 03/31/2016)  R:   ABI: *** (***),   DP: ***,   PT: ***,   TBI: ***  L:   ABI: *** (***),   DP: ***,   PT: ***,   TBI: ***   Bypass Duplex (Date: 12/17/2015)  Proximal to bypass: ***c/s  With-in bypass: *** c/s  Distal to bypass: *** c/s  CFA *** c/s  ***Triphasic throughout    Medical Decision Making  Traci Raymond is  a 65 y.o. (09-06-1950) female  who presents s/p R CFA to BK pop BPG, R 2nd toe amp, R calf exploration and washout, VAC placement   Based on the patient's vascular studies and examination, I have offered the patient: ***BLE ABI, ***bypass duplex, and ***aortoiliac duplex in *** months.  I discussed in depth with the patient the nature of atherosclerosis, and emphasized the importance of maximal medical management including strict control of blood pressure, blood glucose, and lipid levels, obtaining regular exercise, and cessation of smoking.    The patient is aware that without maximal medical management the underlying atherosclerotic disease process will progress,  limiting the benefit of any interventions.  I discussed in depth with the patient the need for long term surveillance to improve the primary assisted patency of his bypass.  The patient agrees to cooperate with such. The patient is currently on a statin: Crestor.  The patient is currently on an anti-platelet: Plavix, ASA.  Thank you for allowing [place] to participate in this patient's care.  Adele Barthel, MD, FACS [facility] [place] Office: [phone] Pager: [phone]  03/31/2016, 5:53 PM

## 2016-04-01 ENCOUNTER — Encounter (HOSPITAL_COMMUNITY): Payer: Medicare Other

## 2016-04-01 ENCOUNTER — Ambulatory Visit: Payer: Medicare Other | Admitting: Vascular Surgery

## 2016-05-20 ENCOUNTER — Encounter (HOSPITAL_COMMUNITY): Payer: Medicare Other

## 2016-05-20 ENCOUNTER — Ambulatory Visit: Payer: Medicare Other | Admitting: Vascular Surgery

## 2016-05-23 ENCOUNTER — Encounter: Payer: Self-pay | Admitting: Vascular Surgery

## 2016-05-26 NOTE — Progress Notes (Signed)
Established Previous Bypass   History of Present Illness  Traci Raymond is a 65 y.o. (06-Jan-1951) female who presents with chief complaint: routine follow up.  Previous operation(s) include:  1. R CFA to BK pop BPG, R 2nd toe amp (Date: 09/09/15).  2. R calf exploration and washout, VAC placement.(10/17/15)  The patient's symptoms have not progressed.  The patient's symptoms are: none. The patient's treatment regimen currently included: maximal medical management.  The patient's PMH, PSH, SH, and FamHx are unchanged from 12/17/15.   Current Outpatient Prescriptions  Medication Sig Dispense Refill  . aspirin EC 81 MG tablet Take 81 mg by mouth every morning.     . cholecalciferol (VITAMIN D) 1000 units tablet Take 1,000 Units by mouth every morning.     . clopidogrel (PLAVIX) 75 MG tablet Take 75 mg by mouth every morning.     . ferrous sulfate 325 (65 FE) MG tablet Take 325 mg by mouth daily with breakfast.    . insulin aspart (NOVOLOG FLEXPEN) 100 UNIT/ML FlexPen Inject 20 Units into the skin 2 (two) times daily with a meal.    . LEVEMIR FLEXTOUCH 100 UNIT/ML Pen Inject 30 Units into the skin at bedtime.     Marland Kitchen lisinopril (PRINIVIL,ZESTRIL) 10 MG tablet Take 10 mg by mouth every morning.     . metFORMIN (GLUCOPHAGE) 1000 MG tablet Take 1,000 mg by mouth 2 (two) times daily with a meal.     . omega-3 acid ethyl esters (LOVAZA) 1 g capsule Take 1 g by mouth every morning.     Marland Kitchen oxyCODONE-acetaminophen (PERCOCET/ROXICET) 5-325 MG tablet Take 1-2 tablets by mouth every 4 (four) hours as needed for moderate pain. 30 tablet 0  . potassium chloride (K-DUR) 10 MEQ tablet Take 10 mEq by mouth every morning.     . rosuvastatin (CRESTOR) 40 MG tablet Take 40 mg by mouth every morning.      No current facility-administered medications for this visit.     No Known Allergies  On ROS today: no rest pain, right 2nd toe amp site healed   Physical Examination  Vitals:   05/27/16 1039    BP: (!) 156/68  Pulse: (!) 58  Resp: 20  Temp: 98.6 F (37 C)  TempSrc: Oral  SpO2: 98%  Weight: 192 lb (87.1 kg)  Height: 5\' 2"  (1.575 m)    Body mass index is 35.12 kg/m.  General: Alert, O x 3, WD,NAD  Pulmonary: Sym exp, good B air movt,CTA B  Cardiac: RRR, Nl S1, S2, no Murmurs, No rubs, No S3,S4  Vascular: Vessel Right Left  Radial Palpable Palpable  Brachial Palpable Palpable  Carotid Palpable, No Bruit Palpable, No Bruit  Aorta Not palpable N/A  Femoral Palpable Palpable  Popliteal Not palpable Not palpable  PT Palpable Palpable  DP Palpable Palpable   Gastrointestinal: soft, non-distended, non-tender to palpation, No guarding or rebound, no HSM, no masses, no CVAT B, No palpable prominent aortic pulse,    Musculoskeletal: M/S 5/5 throughout  , Extremities without ischemic changes except healed R 2nd toe amp  , No edema present, no varicosities evident, No LDS present  Neurologic: CN 2-12 intact , Pain and light touch intact in extremities , Motor exam as listed above   Non-Invasive Vascular Imaging ABI (Date: 05/26/2016)  R:   ABI: 1.14 (0.98)  DP: tri  PT: tri  TBI:  1.61  L:   ABI: 0.82 (0.54),   DP: mono  PT: bi  TBI: 0.81   Medical Decision Making  Traci Raymond is a 65 y.o. female who presents with: s/p R CFA to BK pop BPG, R 2nd toe amp, R calf exploration and washout, VAC placement   Based on the patient's vascular studies and examination, I have offered the patient: BLE ABI and bypass duplex in 6 months.  I discussed in depth with the patient the nature of atherosclerosis, and emphasized the importance of maximal medical management including strict control of blood pressure, blood glucose, and lipid levels, obtaining regular exercise, and cessation of smoking.    The patient is aware that without maximal medical management the underlying atherosclerotic disease process will progress, limiting the benefit of any  interventions.  I discussed in depth with the patient the need for long term surveillance to improve the primary assisted patency of his bypass.  The patient agrees to cooperate with such. The patient is currently on a statin:  Crestor. The patient is currently on an anti-platelet: ASA, Plavix.  Thank you for allowing [place] to participate in this patient's care.   Adele Barthel, MD, FACS [facility] Office: [phone] Pager: [phone]

## 2016-05-27 ENCOUNTER — Ambulatory Visit (HOSPITAL_COMMUNITY)
Admission: RE | Admit: 2016-05-27 | Discharge: 2016-05-27 | Disposition: A | Discharge status: Home / Self Care | Payer: Medicare Other | Source: Ambulatory Visit | Attending: Vascular Surgery | Admitting: Vascular Surgery

## 2016-05-27 ENCOUNTER — Ambulatory Visit (INDEPENDENT_AMBULATORY_CARE_PROVIDER_SITE_OTHER): Payer: Medicare Other | Admitting: Vascular Surgery

## 2016-05-27 ENCOUNTER — Encounter: Payer: Self-pay | Admitting: Vascular Surgery

## 2016-05-27 VITALS — BP 156/68 | HR 58 | Temp 98.6°F | Resp 20 | Ht 62.0 in | Wt 192.0 lb

## 2016-05-27 DIAGNOSIS — I739 Peripheral vascular disease, unspecified: Secondary | ICD-10-CM

## 2016-05-27 DIAGNOSIS — I70269 Atherosclerosis of native arteries of extremities with gangrene, unspecified extremity: Secondary | ICD-10-CM | POA: Diagnosis not present

## 2016-05-27 DIAGNOSIS — I10 Essential (primary) hypertension: Secondary | ICD-10-CM | POA: Diagnosis not present

## 2016-05-27 DIAGNOSIS — R938 Abnormal findings on diagnostic imaging of other specified body structures: Secondary | ICD-10-CM | POA: Insufficient documentation

## 2016-05-27 DIAGNOSIS — Z720 Tobacco use: Secondary | ICD-10-CM | POA: Diagnosis not present

## 2016-05-27 DIAGNOSIS — E785 Hyperlipidemia, unspecified: Secondary | ICD-10-CM | POA: Diagnosis not present

## 2016-05-27 DIAGNOSIS — I70261 Atherosclerosis of native arteries of extremities with gangrene, right leg: Secondary | ICD-10-CM | POA: Diagnosis not present

## 2016-05-27 DIAGNOSIS — E1151 Type 2 diabetes mellitus with diabetic peripheral angiopathy without gangrene: Secondary | ICD-10-CM | POA: Insufficient documentation

## 2016-05-27 DIAGNOSIS — R0989 Other specified symptoms and signs involving the circulatory and respiratory systems: Secondary | ICD-10-CM | POA: Diagnosis present

## 2016-06-21 NOTE — Addendum Note (Signed)
Addended by: Thresa Ross C on: 06/21/2016 02:16 PM   Modules accepted: Orders

## 2016-12-08 ENCOUNTER — Encounter: Payer: Self-pay | Admitting: Vascular Surgery

## 2016-12-12 NOTE — Progress Notes (Signed)
Established Previous Bypass   History of Present Illness   Traci Raymond is a 66 y.o. (May 12, 1951) female who presents with chief complaint: cough.  Previous operation(s) include:   1. R CFA to BK pop BPG, R 2nd toe amp (Date: 09/09/15).  2. R calf exploration and washout, VAC placement.(10/17/15)  The patient's symptoms have bot progressed.  The patient's symptoms are: none. The patient's treatment regimen currently included: maximal medical management.  The patient's PMH, PSH, SH, and FamHx are unchanged from 05/27/16.  Current Outpatient Prescriptions  Medication Sig Dispense Refill  . aspirin EC 81 MG tablet Take 81 mg by mouth every morning.     . cholecalciferol (VITAMIN D) 1000 units tablet Take 1,000 Units by mouth every morning.     . clopidogrel (PLAVIX) 75 MG tablet Take 75 mg by mouth every morning.     . ferrous sulfate 325 (65 FE) MG tablet Take 325 mg by mouth daily with breakfast.    . insulin aspart (NOVOLOG FLEXPEN) 100 UNIT/ML FlexPen Inject 20 Units into the skin 2 (two) times daily with a meal.    . LEVEMIR FLEXTOUCH 100 UNIT/ML Pen Inject 30 Units into the skin at bedtime.     Marland Kitchen lisinopril (PRINIVIL,ZESTRIL) 10 MG tablet Take 10 mg by mouth every morning.     . metFORMIN (GLUCOPHAGE) 1000 MG tablet Take 1,000 mg by mouth 2 (two) times daily with a meal.     . omega-3 acid ethyl esters (LOVAZA) 1 g capsule Take 1 g by mouth every morning.     Marland Kitchen oxyCODONE-acetaminophen (PERCOCET/ROXICET) 5-325 MG tablet Take 1-2 tablets by mouth every 4 (four) hours as needed for moderate pain. 30 tablet 0  . potassium chloride (K-DUR) 10 MEQ tablet Take 10 mEq by mouth every morning.     . rosuvastatin (CRESTOR) 40 MG tablet Take 40 mg by mouth every morning.      No current facility-administered medications for this visit.     No Known Allergies  On ROS today: no rest pain, no intermittent claudication    Physical Examination   Vitals:   12/16/16 1612  BP:  133/64  Pulse: 73  Resp: 18  Temp: 97.7 F (36.5 C)  TempSrc: Oral  SpO2: 97%  Weight: 194 lb 9.6 oz (88.3 kg)  Height: 5\' 2"  (1.575 m)    Body mass index is 35.59 kg/m.  General Alert, O x 3, WD, NAD  Neck +oropharynx with erythema without exudate, +cerv LAD, +tonsillar enlargement  Pulmonary Sym exp, good B air movt, CTA B, coughing  Cardiac RRR, Nl S1, S2, no Murmurs, No rubs, No S3,S4  Vascular Vessel Right Left  Radial Palpable Palpable  Brachial Palpable Palpable  Carotid Palpable, No Bruit Palpable, No Bruit  Aorta Not palpable N/A  Femoral Palpable Palpable  Popliteal Not palpable Not palpable  PT Palpable Not palpable  DP Palpable Not palpable    Gastrointestinal soft, non-distended, non-tender to palpation, No guarding or rebound, no HSM, no masses, no CVAT B, No palpable prominent aortic pulse,    Musculoskeletal M/S 5/5 throughout  , Extremities without ischemic changes except healed R 2nd toe amp, No edema present,  Neurologic Pain and light touch intact in extremities  , Motor exam as listed above    Non-Invasive Vascular Imaging ABI (12/16/2016)  R:   ABI: 0.87 (1.14),   PT: tri  DP: tri  TBI:  0.70  L:   ABI: 0.58 (0.82),   PT: mono  DP: mono  TBI: 0.48  Bypass Duplex (12/15/16)  Proximal to bypass: 334 c/s  With-in bypass: 58-85 c/s  Distal to bypass: 129 c/s   Medical Decision Making   Traci Raymond is a 66 y.o. female who presents with: s/p R CFA to BK pop BPG, R 2nd toe amp, R calf exploration and washout, VAC placement, URI   Discussed briefly with patient concern in inflow to graft given 334 c/s measured today.  At this point, would let patient recover from her URI and readdress possible inflow issue in 3 months.  Based on the patient's vascular studies and examination, I have offered the patient: BLE ABI, bypass duplex, and aortoiliac duplex in 3 months.  I discussed in depth with the patient the nature of  atherosclerosis, and emphasized the importance of maximal medical management including strict control of blood pressure, blood glucose, and lipid levels, obtaining regular exercise, and cessation of smoking.    The patient is aware that without maximal medical management the underlying atherosclerotic disease process will progress, limiting the benefit of any interventions.  I discussed in depth with the patient the need for long term surveillance to improve the primary assisted patency of his bypass.  The patient agrees to cooperate with such. The patient is currently on a statin: Crestor.  The patient is currently on an anti-platelet: Plavix.  Thank you for allowing [place] to participate in this patient's care.   Adele Barthel, MD, FACS [facility] Office: [phone] Pager: [phone]

## 2016-12-16 ENCOUNTER — Encounter: Payer: Self-pay | Admitting: Vascular Surgery

## 2016-12-16 ENCOUNTER — Ambulatory Visit (HOSPITAL_COMMUNITY)
Admission: RE | Admit: 2016-12-16 | Discharge: 2016-12-16 | Disposition: A | Discharge status: Home / Self Care | Payer: Medicare Other | Source: Ambulatory Visit | Attending: Vascular Surgery | Admitting: Vascular Surgery

## 2016-12-16 ENCOUNTER — Ambulatory Visit (INDEPENDENT_AMBULATORY_CARE_PROVIDER_SITE_OTHER): Payer: Medicare Other | Admitting: Vascular Surgery

## 2016-12-16 ENCOUNTER — Ambulatory Visit (INDEPENDENT_AMBULATORY_CARE_PROVIDER_SITE_OTHER)
Admission: RE | Admit: 2016-12-16 | Discharge: 2016-12-16 | Disposition: A | Discharge status: Home / Self Care | Payer: Medicare Other | Source: Ambulatory Visit | Attending: Vascular Surgery | Admitting: Vascular Surgery

## 2016-12-16 VITALS — BP 133/64 | HR 73 | Temp 97.7°F | Resp 18 | Ht 62.0 in | Wt 194.6 lb

## 2016-12-16 DIAGNOSIS — I70261 Atherosclerosis of native arteries of extremities with gangrene, right leg: Secondary | ICD-10-CM | POA: Diagnosis not present

## 2016-12-19 NOTE — Addendum Note (Signed)
Addended by: Lianne Cure A on: 12/19/2016 10:54 AM   Modules accepted: Orders

## 2017-02-28 ENCOUNTER — Encounter: Payer: Self-pay | Admitting: Vascular Surgery

## 2017-03-13 NOTE — Progress Notes (Deleted)
Established Previous Bypass   History of Present Illness   Traci Raymond is a 66 y.o. (Dec 25, 1950) female who presents with chief complaint: cough.  Previous operation(s) include:   1. R CFA to BK pop BPG, R 2nd toe amp (Date: 09/09/15).  2. R calf exploration and washout, VAC placement.(10/17/15)  The patient's symptoms have bot progressed.  The patient's symptoms are: none. The patient's treatment regimen currently included: maximal medical management.  The patient's PMH, PSH, SH, and FamHx are unchanged from 12/16/16.   Current Outpatient Prescriptions  Medication Sig Dispense Refill  . aspirin EC 81 MG tablet Take 81 mg by mouth every morning.     . cholecalciferol (VITAMIN D) 1000 units tablet Take 1,000 Units by mouth every morning.     . clopidogrel (PLAVIX) 75 MG tablet Take 75 mg by mouth every morning.     . ferrous sulfate 325 (65 FE) MG tablet Take 325 mg by mouth daily with breakfast.    . insulin aspart (NOVOLOG FLEXPEN) 100 UNIT/ML FlexPen Inject 20 Units into the skin 2 (two) times daily with a meal.    . LEVEMIR FLEXTOUCH 100 UNIT/ML Pen Inject 30 Units into the skin at bedtime.     Marland Kitchen lisinopril (PRINIVIL,ZESTRIL) 10 MG tablet Take 10 mg by mouth every morning.     . metFORMIN (GLUCOPHAGE) 1000 MG tablet Take 1,000 mg by mouth 2 (two) times daily with a meal.     . omega-3 acid ethyl esters (LOVAZA) 1 g capsule Take 1 g by mouth every morning.     Marland Kitchen oxyCODONE-acetaminophen (PERCOCET/ROXICET) 5-325 MG tablet Take 1-2 tablets by mouth every 4 (four) hours as needed for moderate pain. 30 tablet 0  . potassium chloride (K-DUR) 10 MEQ tablet Take 10 mEq by mouth every morning.     . rosuvastatin (CRESTOR) 40 MG tablet Take 40 mg by mouth every morning.      No current facility-administered medications for this visit.    No Known Allergies  On ROS today: ***, ***   Physical Examination  ***There were no vitals filed for this visit. ***There is no  height or weight on file to calculate BMI.  General Alert, O x 3, WD, NAD  Neck +oropharynx with erythema without exudate, +cerv LAD, +tonsillar enlargement  Pulmonary Sym exp, good B air movt, CTA B, coughing  Cardiac RRR, Nl S1, S2, no Murmurs, No rubs, No S3,S4  Vascular Vessel Right Left  Radial Palpable Palpable  Brachial Palpable Palpable  Carotid Palpable, No Bruit Palpable, No Bruit  Aorta Not palpable N/A  Femoral Palpable Palpable  Popliteal Not palpable Not palpable  PT Palpable Not palpable  DP Palpable Not palpable    Gastrointestinal soft, non-distended, non-tender to palpation, No guarding or rebound, no HSM, no masses, no CVAT B, No palpable prominent aortic pulse,    Musculoskeletal M/S 5/5 throughout  , Extremities without ischemic changes except healed R 2nd toe amp, No edema present,  Neurologic Pain and light touch intact in extremities  , Motor exam as listed above    Non-invasive Vascular Imaging   ABI (***)  R:   ABI: *** (0.87),   PT: {Signals:19197::"none","mono","bi","tri"}  DP: {Signals:19197::"none","mono","bi","tri"}  TBI:  ***  L:   ABI: *** (0.58),   PT: {Signals:19197::"none","mono","bi","tri"}  DP: {Signals:19197::"none","mono","bi","tri"}  TBI: ***  Bypass Duplex (***)  Proximal to bypass: *** c/s  With-in bypass: *** c/s  Distal to bypass: *** c/s   Medical Decision Making  Traci Raymond is a 65 y.o. (1951/01/20) female who presents with: s/p R CFA to BK pop BPG, R 2nd toe amp, R calf exploration and washout, VAC placement   ***Discussed briefly with patient concern in inflow to graft given 334 c/s measured today.  ***At this point, would let patient recover from her URI and readdress possible inflow issue in 3 months.  Based on the patient's vascular studies and examination, I have offered the patient: BLE ABI, bypass duplex, and aortoiliac duplex in 3 months.  I discussed in depth with the patient the  nature of atherosclerosis, and emphasized the importance of maximal medical management including strict control of blood pressure, blood glucose, and lipid levels, obtaining regular exercise, and cessation of smoking.    The patient is aware that without maximal medical management the underlying atherosclerotic disease process will progress, limiting the benefit of any interventions.  I discussed in depth with the patient the need for long term surveillance to improve the primary assisted patency of his bypass.  The patient agrees to cooperate with such.  The patient is currently on a statin: Crestor.   The patient is currently on an anti-platelet: Plavix.  Thank you for allowing [place] to participate in this patient's care.   Adele Barthel, MD, FACS [facility] [place] Office: [phone] Pager: [phone]

## 2017-03-17 ENCOUNTER — Encounter (HOSPITAL_COMMUNITY): Payer: Medicare Other

## 2017-03-17 ENCOUNTER — Ambulatory Visit (HOSPITAL_COMMUNITY): Payer: Medicare Other

## 2017-03-17 ENCOUNTER — Ambulatory Visit: Payer: Medicare Other | Admitting: Vascular Surgery

## 2017-05-03 ENCOUNTER — Other Ambulatory Visit: Payer: Self-pay

## 2017-05-03 DIAGNOSIS — I70269 Atherosclerosis of native arteries of extremities with gangrene, unspecified extremity: Secondary | ICD-10-CM

## 2017-05-09 NOTE — Progress Notes (Signed)
Established Previous Bypass   History of Present Illness   Traci Raymond is a 66 y.o. ([birth year]) female   who presents with chief complaint: none.  Previous operation(s) include:   1. R CFA to BK pop BPG, R 2nd toe amp (Date: 09/09/15).  2. R calf exploration and washout, VAC placement.(10/17/15)  The patient's symptoms have notprogressed.  The patient's symptoms are: none. The patient's treatment regimen currently included: maximal medical management.  Concerns with inflow disease were raised last visit, but pt had significant URI, so I felt delaying possible intervention would be ok.  The patient's PMH, PSH, SH, and FamHx are unchanged from 12/16/16.  Current Outpatient Prescriptions  Medication Sig Dispense Refill  . aspirin EC 81 MG tablet Take 81 mg by mouth every morning.     . cholecalciferol (VITAMIN D) 1000 units tablet Take 1,000 Units by mouth every morning.     . clopidogrel (PLAVIX) 75 MG tablet Take 75 mg by mouth every morning.     . ferrous sulfate 325 (65 FE) MG tablet Take 325 mg by mouth daily with breakfast.    . insulin aspart (NOVOLOG FLEXPEN) 100 UNIT/ML FlexPen Inject 20 Units into the skin 2 (two) times daily with a meal.    . LEVEMIR FLEXTOUCH 100 UNIT/ML Pen Inject 30 Units into the skin at bedtime.     Marland Kitchen lisinopril (PRINIVIL,ZESTRIL) 10 MG tablet Take 10 mg by mouth every morning.     . metFORMIN (GLUCOPHAGE) 1000 MG tablet Take 1,000 mg by mouth 2 (two) times daily with a meal.     . omega-3 acid ethyl esters (LOVAZA) 1 g capsule Take 1 g by mouth every morning.     Marland Kitchen oxyCODONE-acetaminophen (PERCOCET/ROXICET) 5-325 MG tablet Take 1-2 tablets by mouth every 4 (four) hours as needed for moderate pain. 30 tablet 0  . potassium chloride (K-DUR) 10 MEQ tablet Take 10 mEq by mouth every morning.     . rosuvastatin (CRESTOR) 40 MG tablet Take 40 mg by mouth every morning.      No current facility-administered medications for this visit.    No  Known Allergies  On ROS today: no gangrene, no intermittent claudication    Physical Examination   Vitals:   05/12/17 1122  BP: (!) 159/70  Pulse: 68  Resp: 16  SpO2: 98%  Weight: 190 lb (86.2 kg)  Height: 5\' 2"  (1.575 m)   Body mass index is 34.75 kg/m.  General Alert, O x 3, WD, NAD  Neck +oropharynx with erythema without exudate, +cerv LAD, +tonsillar enlargement  Pulmonary Sym exp, good B air movt, CTA B, coughing  Cardiac RRR, Nl S1, S2, no Murmurs, No rubs, No S3,S4  Vascular Vessel Right Left  Radial Palpable Palpable  Brachial Palpable Palpable  Carotid Palpable, No Bruit Palpable, No Bruit  Aorta Not palpable N/A  Femoral Palpable Palpable  Popliteal Not palpable Not palpable  PT Palpable Not palpable  DP Palpable Not palpable    Gastrointestinal soft, non-distended, non-tender to palpation, No guarding or rebound, no HSM, no masses, no CVAT B, No palpable prominent aortic pulse,    Musculoskeletal M/S 5/5 throughout  , Extremities without ischemic changes except healed R 2nd toe amp, No edema present,  Neurologic Pain and light touch intact in extremities  , Motor exam as listed above    Non-invasive Vascular Imaging   ABI (05/12/2017)  R:   ABI: 0.93 (0.87),   PT: bi  DP: tri  TBI:  0.85  L:   ABI: 0.70 (0.58),   PT: mono  DP: mono  TBI: 0.55   RLE Bypass Duplex (05/12/2017)  Proximal to bypass: 314 c/s  Proximal anastomosis: 292 c/s  With-in bypass: 69-70 c/s  Distal anastomosis: 119 c/s  Distal to bypass: 150 c/s  Aortoiliac duplex (05/12/2017)  Ao: 72-90 c/s  R: CIA 45-103 c/s, EIA 228-354 c/s  L: CIA 39-96 c/s, EIA 191-243 c/s   Medical Decision Making   Traci Raymond is a 66 y.o. 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  I recommended: Aortogram, B pelvic angiogram, possible R leg intervention. I discussed with the patient the nature of angiographic procedures, especially the limited patencies of any endovascular intervention.   The patient is aware of that the risks of an angiographic procedure include but are not limited to: bleeding, infection, access site complications, renal failure, embolization, rupture of vessel, dissection, arteriovenous fistula, possible need for emergent surgical intervention, possible need for surgical procedures to treat the patient's pathology, anaphylactic reaction to contrast, and stroke and death.   The patient is aware of the risks and agrees to proceed.  I discussed in depth with the patient the nature of atherosclerosis, and emphasized the importance of maximal medical management including strict control of blood pressure, blood glucose, and lipid levels, obtaining regular exercise, and cessation of smoking.    The patient is aware that without maximal medical management the underlying atherosclerotic disease process will progress, limiting the benefit of any interventions.  I discussed in depth with the patient the need for long term surveillance to improve the primary assisted patency of his bypass.  The patient agrees to cooperate with such.  The patient is currently on a statin: Crestor.   The patient is currently on an anti-platelet: Plavix.  Thank you for allowing [place] to participate in this patient's care.   Adele Barthel, MD, FACS [facility] Office: [phone] Pager: [phone]

## 2017-05-12 ENCOUNTER — Ambulatory Visit (INDEPENDENT_AMBULATORY_CARE_PROVIDER_SITE_OTHER): Payer: Medicare Other | Admitting: Vascular Surgery

## 2017-05-12 ENCOUNTER — Encounter: Payer: Self-pay | Admitting: Vascular Surgery

## 2017-05-12 ENCOUNTER — Ambulatory Visit (INDEPENDENT_AMBULATORY_CARE_PROVIDER_SITE_OTHER)
Admission: RE | Admit: 2017-05-12 | Discharge: 2017-05-12 | Disposition: A | Discharge status: Home / Self Care | Payer: Medicare Other | Source: Ambulatory Visit | Attending: Vascular Surgery | Admitting: Vascular Surgery

## 2017-05-12 ENCOUNTER — Ambulatory Visit (HOSPITAL_COMMUNITY)
Admission: RE | Admit: 2017-05-12 | Discharge: 2017-05-12 | Disposition: A | Discharge status: Home / Self Care | Payer: Medicare Other | Source: Ambulatory Visit | Attending: Vascular Surgery | Admitting: Vascular Surgery

## 2017-05-12 ENCOUNTER — Encounter: Payer: Self-pay | Admitting: *Deleted

## 2017-05-12 VITALS — BP 159/70 | HR 68 | Resp 16 | Ht 62.0 in | Wt 190.0 lb

## 2017-05-12 DIAGNOSIS — I70261 Atherosclerosis of native arteries of extremities with gangrene, right leg: Secondary | ICD-10-CM | POA: Diagnosis not present

## 2017-05-12 DIAGNOSIS — Z95828 Presence of other vascular implants and grafts: Secondary | ICD-10-CM | POA: Insufficient documentation

## 2017-05-12 DIAGNOSIS — E1151 Type 2 diabetes mellitus with diabetic peripheral angiopathy without gangrene: Secondary | ICD-10-CM | POA: Diagnosis not present

## 2017-05-12 DIAGNOSIS — I70269 Atherosclerosis of native arteries of extremities with gangrene, unspecified extremity: Secondary | ICD-10-CM | POA: Diagnosis not present

## 2017-05-12 DIAGNOSIS — F17200 Nicotine dependence, unspecified, uncomplicated: Secondary | ICD-10-CM | POA: Diagnosis not present

## 2017-05-12 DIAGNOSIS — I10 Essential (primary) hypertension: Secondary | ICD-10-CM | POA: Insufficient documentation

## 2017-05-12 DIAGNOSIS — R0989 Other specified symptoms and signs involving the circulatory and respiratory systems: Secondary | ICD-10-CM | POA: Diagnosis present

## 2017-05-25 ENCOUNTER — Ambulatory Visit (HOSPITAL_COMMUNITY)
Admission: RE | Admit: 2017-05-25 | Discharge: 2017-05-25 | Disposition: A | Discharge status: Home / Self Care | Payer: Medicare Other | Source: Ambulatory Visit | Attending: Vascular Surgery | Admitting: Vascular Surgery

## 2017-05-25 ENCOUNTER — Telehealth: Payer: Self-pay | Admitting: Vascular Surgery

## 2017-05-25 ENCOUNTER — Encounter (HOSPITAL_COMMUNITY)
Admission: RE | Disposition: A | Discharge status: Home / Self Care | Payer: Self-pay | Source: Ambulatory Visit | Attending: Vascular Surgery

## 2017-05-25 DIAGNOSIS — I70332 Atherosclerosis of unspecified type of bypass graft(s) of the right leg with ulceration of calf: Secondary | ICD-10-CM | POA: Diagnosis not present

## 2017-05-25 DIAGNOSIS — I70232 Atherosclerosis of native arteries of right leg with ulceration of calf: Secondary | ICD-10-CM | POA: Insufficient documentation

## 2017-05-25 DIAGNOSIS — Z7902 Long term (current) use of antithrombotics/antiplatelets: Secondary | ICD-10-CM | POA: Insufficient documentation

## 2017-05-25 DIAGNOSIS — L97219 Non-pressure chronic ulcer of right calf with unspecified severity: Secondary | ICD-10-CM | POA: Insufficient documentation

## 2017-05-25 DIAGNOSIS — I70201 Unspecified atherosclerosis of native arteries of extremities, right leg: Secondary | ICD-10-CM

## 2017-05-25 DIAGNOSIS — Z89421 Acquired absence of other right toe(s): Secondary | ICD-10-CM | POA: Diagnosis not present

## 2017-05-25 DIAGNOSIS — I70269 Atherosclerosis of native arteries of extremities with gangrene, unspecified extremity: Secondary | ICD-10-CM | POA: Diagnosis present

## 2017-05-25 DIAGNOSIS — Z7982 Long term (current) use of aspirin: Secondary | ICD-10-CM | POA: Insufficient documentation

## 2017-05-25 DIAGNOSIS — T82858A Stenosis of vascular prosthetic devices, implants and grafts, initial encounter: Secondary | ICD-10-CM | POA: Diagnosis not present

## 2017-05-25 DIAGNOSIS — I708 Atherosclerosis of other arteries: Secondary | ICD-10-CM | POA: Diagnosis present

## 2017-05-25 HISTORY — PX: PERIPHERAL VASCULAR INTERVENTION: CATH118257

## 2017-05-25 HISTORY — PX: ABDOMINAL AORTOGRAM W/LOWER EXTREMITY: CATH118223

## 2017-05-25 LAB — POCT I-STAT, CHEM 8
BUN: 10 mg/dL (ref 6–20)
CALCIUM ION: 1.14 mmol/L — AB (ref 1.15–1.40)
CREATININE: 1.3 mg/dL — AB (ref 0.44–1.00)
Chloride: 107 mmol/L (ref 101–111)
GLUCOSE: 155 mg/dL — AB (ref 65–99)
HCT: 36 % (ref 36.0–46.0)
Hemoglobin: 12.2 g/dL (ref 12.0–15.0)
Potassium: 3.5 mmol/L (ref 3.5–5.1)
Sodium: 141 mmol/L (ref 135–145)
TCO2: 26 mmol/L (ref 22–32)

## 2017-05-25 LAB — POCT ACTIVATED CLOTTING TIME
ACTIVATED CLOTTING TIME: 466 s
ACTIVATED CLOTTING TIME: 241 s
Activated Clotting Time: 186 seconds

## 2017-05-25 LAB — GLUCOSE, CAPILLARY: GLUCOSE-CAPILLARY: 138 mg/dL — AB (ref 65–99)

## 2017-05-25 SURGERY — ABDOMINAL AORTOGRAM W/LOWER EXTREMITY
Anesthesia: LOCAL | Laterality: Right

## 2017-05-25 MED ORDER — FENTANYL CITRATE (PF) 100 MCG/2ML IJ SOLN
INTRAMUSCULAR | Status: DC | PRN
  Administered 2017-05-25: 50 ug via INTRAVENOUS

## 2017-05-25 MED ORDER — SODIUM CHLORIDE 0.9% FLUSH: 3.0000 mL | INTRAVENOUS | Status: DC | PRN

## 2017-05-25 MED ORDER — MIDAZOLAM HCL 2 MG/2ML IJ SOLN
INTRAMUSCULAR | Status: DC | PRN
  Administered 2017-05-25: 1 mg via INTRAVENOUS

## 2017-05-25 MED ORDER — SODIUM CHLORIDE 0.9 % WEIGHT BASED INFUSION: 1.0000 mL/kg/h | INTRAVENOUS | Status: DC

## 2017-05-25 MED ORDER — FENTANYL CITRATE (PF) 100 MCG/2ML IJ SOLN
INTRAMUSCULAR | Status: AC
  Filled 2017-05-25: qty 2

## 2017-05-25 MED ORDER — MORPHINE SULFATE (PF) 10 MG/ML IV SOLN: 2.0000 mg | INTRAVENOUS | Status: DC | PRN

## 2017-05-25 MED ORDER — IODIXANOL 320 MG/ML IV SOLN
INTRAVENOUS | Status: DC | PRN
Start: 2017-05-25 — End: 2017-05-25
  Administered 2017-05-25: 100 mL via INTRA_ARTERIAL

## 2017-05-25 MED ORDER — LIDOCAINE HCL 2 % IJ SOLN
INTRAMUSCULAR | Status: DC | PRN
  Administered 2017-05-25: 10 mL via INTRADERMAL

## 2017-05-25 MED ORDER — HEPARIN (PORCINE) IN NACL 2-0.9 UNIT/ML-% IJ SOLN
INTRAMUSCULAR | Status: AC | PRN
  Administered 2017-05-25: 1000 mL via INTRA_ARTERIAL

## 2017-05-25 MED ORDER — MIDAZOLAM HCL 2 MG/2ML IJ SOLN
INTRAMUSCULAR | Status: AC
  Filled 2017-05-25: qty 2

## 2017-05-25 MED ORDER — HEPARIN (PORCINE) IN NACL 2-0.9 UNIT/ML-% IJ SOLN
INTRAMUSCULAR | Status: AC
  Filled 2017-05-25: qty 1000

## 2017-05-25 MED ORDER — HYDRALAZINE HCL 20 MG/ML IJ SOLN: 5.0000 mg | INTRAMUSCULAR | Status: DC | PRN

## 2017-05-25 MED ORDER — SODIUM CHLORIDE 0.9% FLUSH: 3.0000 mL | Freq: Two times a day (BID) | INTRAVENOUS | Status: DC

## 2017-05-25 MED ORDER — LABETALOL HCL 5 MG/ML IV SOLN: 10.0000 mg | INTRAVENOUS | Status: DC | PRN

## 2017-05-25 MED ORDER — LIDOCAINE HCL 2 % IJ SOLN
INTRAMUSCULAR | Status: AC
  Filled 2017-05-25: qty 10

## 2017-05-25 MED ORDER — OXYCODONE HCL 5 MG PO TABS: 5.0000 mg | ORAL_TABLET | ORAL | Status: DC | PRN

## 2017-05-25 MED ORDER — HEPARIN SODIUM (PORCINE) 1000 UNIT/ML IJ SOLN
INTRAMUSCULAR | Status: DC | PRN
  Administered 2017-05-25: 9000 [IU] via INTRAVENOUS

## 2017-05-25 MED ORDER — SODIUM CHLORIDE 0.9 % IV SOLN: 250.0000 mL | INTRAVENOUS | Status: DC | PRN

## 2017-05-25 MED ORDER — HEPARIN SODIUM (PORCINE) 1000 UNIT/ML IJ SOLN
INTRAMUSCULAR | Status: AC
  Filled 2017-05-25: qty 1

## 2017-05-25 MED ORDER — SODIUM CHLORIDE 0.9 % IV SOLN
INTRAVENOUS | Status: DC
  Administered 2017-05-25: 09:00:00 via INTRAVENOUS

## 2017-05-25 SURGICAL SUPPLY — 22 items
BALLN MUSTANG 4X20X135 (BALLOONS) ×3
BALLN MUSTANG 6X60X75 (BALLOONS) ×3
BALLOON MUSTANG 4X20X135 (BALLOONS) ×2 IMPLANT
BALLOON MUSTANG 6X60X75 (BALLOONS) ×2 IMPLANT
CATH ANGIO 5F BER2 65CM (CATHETERS) ×3 IMPLANT
CATH MUSTANG 3X20X135 (BALLOONS) ×3 IMPLANT
CATH OMNI FLUSH 5F 65CM (CATHETERS) ×3 IMPLANT
KIT ENCORE 26 ADVANTAGE (KITS) ×3 IMPLANT
KIT MICROINTRODUCER STIFF 5F (SHEATH) ×3 IMPLANT
KIT PV (KITS) ×3 IMPLANT
SHEATH PINNACLE 5F 10CM (SHEATH) ×3 IMPLANT
SHEATH PINNACLE 8F 10CM (SHEATH) ×3 IMPLANT
SHEATH PINNACLE ST 7F 45CM (SHEATH) ×3 IMPLANT
STENT INNOVA 6X40X130 (Permanent Stent) ×3 IMPLANT
STENT INNOVA 8X40X130 (Permanent Stent) ×3 IMPLANT
SYR MEDRAD MARK V 150ML (SYRINGE) ×3 IMPLANT
TAPE RADIOPAQUE TURBO (MISCELLANEOUS) ×3 IMPLANT
TRANSDUCER W/STOPCOCK (MISCELLANEOUS) ×3 IMPLANT
TRAY PV CATH (CUSTOM PROCEDURE TRAY) ×3 IMPLANT
WIRE BENTSON .035X145CM (WIRE) ×3 IMPLANT
WIRE HI TORQ VERSACORE J 260CM (WIRE) ×3 IMPLANT
WIRE ROSEN-J .035X180CM (WIRE) ×3 IMPLANT

## 2017-05-25 NOTE — Telephone Encounter (Signed)
-----   Message from Mena Goes, RN sent at 05/25/2017  2:56 PM EDT ----- Regarding: 4 weeks   ----- Message ----- From: Conrad , MD Sent: 05/25/2017   2:30 PM To: [facility] [address]  [name] [id number] 06-30-1951   PROCEDURE: 1.  left common femoral artery cannulation under ultrasound guidance 2.  Placement of catheter in aorta 3.  Second order arterial selection 4.  Right leg runoff via catheter 5.  Angioplasty and stenting right external iliac artery x 2 (6 mm x 40 mm, 8 mm x 40 mm) 6.  Angioplasty of right common femoral artery and proximal femoropopliteal anastomosis (6 mm x 60 mm) 7.  Angioplasty of right profunda femoral artery x 2 (3 mm x 20 mm, 4 mm x 20 mm) 8.  Conscious sedation for 69 minutes  Follow-up: 4 weeks

## 2017-05-25 NOTE — Progress Notes (Signed)
Site area: Left groin a 8 french arterial sheath was removed groin  Site Prior to Removal:  Level 0  Pressure Applied For 20 MINUTES    Bedrest Beginning at  1530p Manual:   Yes.    Patient Status During Pull:  stable  Post Pull Groin Site:  Level 0  Post Pull Instructions Given:  Yes.    Post Pull Pulses Present:  Yes.    Dressing Applied:  Yes.    Comments:  VS remain  Stable during sheath pull

## 2017-05-25 NOTE — Telephone Encounter (Signed)
Sched appt 06/23/17 at 11:00. Lm on hm#.

## 2017-05-25 NOTE — H&P (View-Only) (Signed)
Established Previous Bypass   History of Present Illness   Traci Raymond is a 66 y.o. (03-24-1951) female   who presents with chief complaint: none.  Previous operation(s) include:   1. R CFA to BK pop BPG, R 2nd toe amp (Date: 09/09/15).  2. R calf exploration and washout, VAC placement.(10/17/15)  The patient's symptoms have notprogressed.  The patient's symptoms are: none. The patient's treatment regimen currently included: maximal medical management.  Concerns with inflow disease were raised last visit, but pt had significant URI, so I felt delaying possible intervention would be ok.  The patient's PMH, PSH, SH, and FamHx are unchanged from 12/16/16.  Current Outpatient Prescriptions  Medication Sig Dispense Refill  . aspirin EC 81 MG tablet Take 81 mg by mouth every morning.     . cholecalciferol (VITAMIN D) 1000 units tablet Take 1,000 Units by mouth every morning.     . clopidogrel (PLAVIX) 75 MG tablet Take 75 mg by mouth every morning.     . ferrous sulfate 325 (65 FE) MG tablet Take 325 mg by mouth daily with breakfast.    . insulin aspart (NOVOLOG FLEXPEN) 100 UNIT/ML FlexPen Inject 20 Units into the skin 2 (two) times daily with a meal.    . LEVEMIR FLEXTOUCH 100 UNIT/ML Pen Inject 30 Units into the skin at bedtime.     Marland Kitchen lisinopril (PRINIVIL,ZESTRIL) 10 MG tablet Take 10 mg by mouth every morning.     . metFORMIN (GLUCOPHAGE) 1000 MG tablet Take 1,000 mg by mouth 2 (two) times daily with a meal.     . omega-3 acid ethyl esters (LOVAZA) 1 g capsule Take 1 g by mouth every morning.     Marland Kitchen oxyCODONE-acetaminophen (PERCOCET/ROXICET) 5-325 MG tablet Take 1-2 tablets by mouth every 4 (four) hours as needed for moderate pain. 30 tablet 0  . potassium chloride (K-DUR) 10 MEQ tablet Take 10 mEq by mouth every morning.     . rosuvastatin (CRESTOR) 40 MG tablet Take 40 mg by mouth every morning.      No current facility-administered medications for this visit.    No  Known Allergies  On ROS today: no gangrene, no intermittent claudication    Physical Examination   Vitals:   05/12/17 1122  BP: (!) 159/70  Pulse: 68  Resp: 16  SpO2: 98%  Weight: 190 lb (86.2 kg)  Height: 5\' 2"  (1.575 m)   Body mass index is 34.75 kg/m.  General Alert, O x 3, WD, NAD  Neck +oropharynx with erythema without exudate, +cerv LAD, +tonsillar enlargement  Pulmonary Sym exp, good B air movt, CTA B, coughing  Cardiac RRR, Nl S1, S2, no Murmurs, No rubs, No S3,S4  Vascular Vessel Right Left  Radial Palpable Palpable  Brachial Palpable Palpable  Carotid Palpable, No Bruit Palpable, No Bruit  Aorta Not palpable N/A  Femoral Palpable Palpable  Popliteal Not palpable Not palpable  PT Palpable Not palpable  DP Palpable Not palpable    Gastrointestinal soft, non-distended, non-tender to palpation, No guarding or rebound, no HSM, no masses, no CVAT B, No palpable prominent aortic pulse,    Musculoskeletal M/S 5/5 throughout  , Extremities without ischemic changes except healed R 2nd toe amp, No edema present,  Neurologic Pain and light touch intact in extremities  , Motor exam as listed above    Non-invasive Vascular Imaging   ABI (05/12/2017)  R:   ABI: 0.93 (0.87),   PT: bi  DP: tri  TBI:  0.85  L:   ABI: 0.70 (0.58),   PT: mono  DP: mono  TBI: 0.55   RLE Bypass Duplex (05/12/2017)  Proximal to bypass: 314 c/s  Proximal anastomosis: 292 c/s  With-in bypass: 69-70 c/s  Distal anastomosis: 119 c/s  Distal to bypass: 150 c/s  Aortoiliac duplex (05/12/2017)  Ao: 72-90 c/s  R: CIA 45-103 c/s, EIA 228-354 c/s  L: CIA 39-96 c/s, EIA 191-243 c/s   Medical Decision Making   Traci Raymond is a 66 y.o. 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  I recommended: Aortogram, B pelvic angiogram, possible R leg intervention. I discussed with the patient the nature of angiographic procedures, especially the limited patencies of any endovascular intervention.   The patient is aware of that the risks of an angiographic procedure include but are not limited to: bleeding, infection, access site complications, renal failure, embolization, rupture of vessel, dissection, arteriovenous fistula, possible need for emergent surgical intervention, possible need for surgical procedures to treat the patient's pathology, anaphylactic reaction to contrast, and stroke and death.   The patient is aware of the risks and agrees to proceed.  I discussed in depth with the patient the nature of atherosclerosis, and emphasized the importance of maximal medical management including strict control of blood pressure, blood glucose, and lipid levels, obtaining regular exercise, and cessation of smoking.    The patient is aware that without maximal medical management the underlying atherosclerotic disease process will progress, limiting the benefit of any interventions.  I discussed in depth with the patient the need for long term surveillance to improve the primary assisted patency of his bypass.  The patient agrees to cooperate with such.  The patient is currently on a statin: Crestor.   The patient is currently on an anti-platelet: Plavix.  Thank you for allowing [place] to participate in this patient's care.   Adele Barthel, MD, FACS [facility] Office: [phone] Pager: [phone]

## 2017-05-25 NOTE — Discharge Instructions (Signed)

## 2017-05-25 NOTE — Op Note (Signed)
OPERATIVE NOTE   PROCEDURE: 1.  left common femoral artery cannulation under ultrasound guidance 2.  Placement of catheter in aorta 3.  Second order arterial selection 4.  Right leg runoff via catheter 5.  Angioplasty and stenting right external iliac artery x 2 (6 mm x 40 mm, 8 mm x 40 mm) 6.  Angioplasty of right common femoral artery and proximal femoropopliteal anastomosis (6 mm x 60 mm) 7.  Angioplasty of right profunda femoral artery x 2 (3 mm x 20 mm, 4 mm x 20 mm) 8.  Conscious sedation for 69 minutes  PRE-OPERATIVE DIAGNOSIS: right external iliac artery stenosis, proximal femoropopliteal bypass stenosis  POST-OPERATIVE DIAGNOSIS: same as above   SURGEON: Adele Barthel, MD  ANESTHESIA: conscious sedation  ESTIMATED BLOOD LOSS: 50 cc  CONTRAST: 100 cc  FINDING(S):  Aorta: Patent  Superior mesenteric artery: Patent Celiac artery: Patent Inferior mesenteric artery: patent   Right Left  RA patent patent  CIA patent patent  EIA Patent with distal stenosis >90% and proximal >75%: both resolved with stenting and angioplasty patent  IIA Patent Patent  CFA Patent Patent  SFA Occluded superficial femoral artery, Patent femoropopliteal bypass to below-the-knee popliteal artery: anastomotic stenosis >50% (calcific plaque): resolved after angioplasty   PFA Patent, 75-90% stenois proximally: <30% residual stenosis after serial angioplasty   Pop Widely patent distal anastomosis to smaller below-the-knee popliteal artery   Trif Patent   AT Patent   Pero Patent   PT Patent    SPECIMEN(S):  none  INDICATIONS:   Traci Raymond is a 66 y.o. female who presents with abnormal non-invasive studies of her right iliac arterial system and proximal right femoropopliteal bypass.  The patient presents for: aortogram, bilateral angiogram, possible intervention on iliac arterial system and proximal bypass graft.  I discussed with the patient the nature of angiographic procedures,  especially the limited patencies of any endovascular intervention.  The patient is aware of that the risks of an angiographic procedure include but are not limited to: bleeding, infection, access site complications, renal failure, embolization, rupture of vessel, dissection, possible need for emergent surgical intervention, possible need for surgical procedures to treat the patient's pathology, and stroke and death.  The patient is aware of the risks and agrees to proceed.  DESCRIPTION: After full informed consent was obtained from the patient, the patient was brought back to the angiography suite.  The patient was placed supine upon the angiography table and connected to cardiopulmonary monitoring equipment.  The patient was then given conscious sedation, the amounts of which are documented in the patient's chart.  A circulating radiologic technician maintained continuous monitoring of the patient's cardiopulmonary status.  Additionally, the control room radiologic technician provided backup monitoring throughout the procedure.  The patient was prepped and drape in the standard fashion for an angiographic procedure.  At this point, attention was turned to the left groin.  Under ultrasound guidance, the subcutaneous tissue surrounding the left common femoral artery was anesthesized with 1% lidocaine with epinephrine.  The artery was then cannulated with a micropuncture needle.  The microwire was advanced into the iliac arterial system.  The needle was exchanged for a microsheath, which was loaded into the common femoral artery over the wire.  The microwire was exchanged for a Bentson wire which was advanced into the aorta.  The microsheath was then exchanged for a 5-Fr sheath which was loaded into the common femoral artery.  The Omniflush catheter was then loaded over the wire up  to the level of L1.  The catheter was connected to the power injector circuit.  After de-airring and de-clotting the circuit, a  power injector aortogram was completed.  The findings are listed above.    Using a Bentson wire and Omniflush catheter, the right common iliac artery was selected.  The catheter and wire were advanced into the external iliac artery.  The wire was removed and the catheter connected to the power injector circuit.  An automated right leg runoff was completed.  The findings are listed above.  Based on the images, intervention on the right external iliac artery and proximal anastomosis was indicated.  The wire was exchanged for a Rosen wire.  The patient's left femoral sheath was exchanged for a 7-Fr Destination sheath, which was lodged in the right external iliac artery.  The dilator was removed.  The patient was given 9000 units of Heparin intravenously, which was a therapeutic bolus.  I placed a BER-2 catheter over the wire and used this combination to advance the wire into the femoropopliteal graft.  At this point, I confirmed the position of the lesion with a hand injection.  Based on measurements, a 6 mm x 40 mm self expanding stent was selected.  This stent was positioned, centered on the right external iliac artery stenosis.  I deployed the stent and then post-dilated the stent with a 6 mm x 40 mm balloon at 10 ATM for 1 minute.  I then centered the balloon on the proximal anastomosis.  I inflated the balloon at 10 ATM for 1 minute.  There was an obvious waist that resolved with inflation.  I deflated the balloon and removed the balloon.  Hand injection demonstrated resolution of the external iliac artery stenosis and resolution of the proximal anastomotic stenosis.  I also demonstrated a profunda femoral artery stenosis 75-90%.  I also noticed that the contrast was hanging up.  I pull the sheath more proximally into the right common iliac artery and this hang up resolved, proving there was a more proximal hemodynamically significant stenosis.  I loaded the BER-2 catheter over the wire and selected the  right profunda femoral artery.  Based on measurements, I selected first a 3 mm x 20 mm angioplasty balloon.  This was inflated in the proximal profunda femoral artery.  There was minimal waist, so I exchanged this balloon for a 4 mm x 20 mm balloon.  This was inflated to 10 ATM for 1 minute.  This demonstrated a visible waist which resolved.  Completion injection demonstrated <30% residual stenosis.  I had concerns more aggressive angioplasty might cause a dissection, so I elected to stop at this point.  I turned my attention to the proximal iliac arterial system.  Hand injection, demonstrated a proximal stenosis >75%.  The proximal external iliac artery was larger than distal, so measurements lead to selection of 8 mm x 40 mm self expanding stent.  This deployed with 1 cm overlap with the 6 mm stent.  The stent was post-dilated with the 6 mm x 60 mm angioplasty balloon at 10 ATM for 1 minute.  Proximally, the stent appeared to flare >6 mm.  On completion angiogram, the proximal stent appeared to flare slight into right internal iliac artery without covering it significantly.  At this point, I pulled the wire and sheath into the right external iliac artery.  There was some venous ooze from the left groin site, so I elected to exchange the left sheath for a regular 8-Fr  sheath.  This appeared to resolve much of the venous ooze.  The sheath was aspirated.  No clots were present and the sheath was reloaded with heparinized saline.     COMPLICATIONS: none  CONDITION: stable   Adele Barthel, MD, [facility] [place] Office: [phone] Pager: [phone]  05/25/2017, 2:11 PM

## 2017-05-25 NOTE — Interval H&P Note (Signed)
   History and Physical Update  The patient was interviewed and re-examined.  The patient's previous History and Physical has been reviewed and is unchanged from my consult.  There is no change in the plan of care: aortogram, right leg runoff, and possible intervention.   I discussed with the patient the nature of angiographic procedures, especially the limited patencies of any endovascular intervention.    The patient is aware of that the risks of an angiographic procedure include but are not limited to: bleeding, infection, access site complications, renal failure, embolization, rupture of vessel, dissection, arteriovenous fistula, possible need for emergent surgical intervention, possible need for surgical procedures to treat the patient's pathology, anaphylactic reaction to contrast, and stroke and death.    The patient is aware of the risks and agrees to proceed.   Adele Barthel, MD, FACS [facility] [place] Office: [phone] Pager: [phone]  05/25/2017, 9:06 AM

## 2017-05-26 ENCOUNTER — Encounter (HOSPITAL_COMMUNITY): Payer: Self-pay | Admitting: Vascular Surgery

## 2017-06-17 NOTE — Progress Notes (Signed)
Postoperative Visit (Angio)   History of Present Illness   Traci Raymond is a 66 y.o. female who presents cc:  No complaints.  Prior procedure include: 1.  PTA+S R EIA x 2, PTA R CFA and proximal fem-pop anastomosis, PTA R PFA (05/25/17) 2.  R calf exploration and washout, VAC placement (10/17/15) 3.  R CFA to BK pop BPG w/ Propaten, R 2nd toe amp  The patient's wounds are healed.  The patient notes persistent completionresolution of lower extremity symptoms.  The patient is able to complete their activities of daily living.  The patient's current symptoms are: none.  Past Medical History, Past Surgical History, Social History, Family History, Medications, Allergies, and Review of Systems are unchanged from previous evaluation on 05/25/17.  Current Outpatient Medications  Medication Sig Dispense Refill  . amLODipine (NORVASC) 10 MG tablet Take 10 mg by mouth daily.    Marland Kitchen aspirin EC 81 MG tablet Take 81 mg by mouth every morning.     . cholecalciferol (VITAMIN D) 1000 units tablet Take 1,000 Units by mouth every morning.     . clopidogrel (PLAVIX) 75 MG tablet Take 75 mg by mouth every morning.     . ferrous sulfate 325 (65 FE) MG tablet Take 325 mg by mouth daily with breakfast.    . insulin aspart (NOVOLOG FLEXPEN) 100 UNIT/ML FlexPen Inject 30 Units into the skin 2 (two) times daily with a meal.     . LEVEMIR FLEXTOUCH 100 UNIT/ML Pen Inject 30 Units into the skin at bedtime.     Marland Kitchen lisinopril (PRINIVIL,ZESTRIL) 20 MG tablet Take 20 mg by mouth daily.    . metFORMIN (GLUCOPHAGE) 1000 MG tablet Take 1,000 mg by mouth 2 (two) times daily with a meal.     . omega-3 acid ethyl esters (LOVAZA) 1 g capsule Take 1 g by mouth every morning.     . rosuvastatin (CRESTOR) 40 MG tablet Take 40 mg by mouth every morning.     . sitaGLIPtin (JANUVIA) 100 MG tablet Take 100 mg by mouth daily.     No current facility-administered medications for this visit.     ROS: no intermittent  claudication , no foot ulcers   For VQI Use Only   PRE-ADM LIVING: Home  AMB STATUS: Ambulatory   Physical Examination   Vitals:   06/23/17 1107  BP: 123/66  Pulse: 79  Resp: 14  Temp: 99.5 F (37.5 C)  SpO2: 100%  Weight: 190 lb (86.2 kg)  Height: 5\' 2"  (1.575 m)   Body mass index is 34.75 kg/m.  General Alert, O x 3, WD, NAD  Pulmonary Sym exp, good B air movt, CTA B  Cardiac RRR, Nl S1, S2, no Murmurs, No rubs, No S3,S4  Vascular Vessel Right Left  Radial Palpable Palpable  Brachial Palpable Palpable  Carotid Palpable, No Bruit Palpable, No Bruit  Aorta Not palpable N/A  Femoral Palpable Palpable  Popliteal Not palpable Not palpable  PT Faintly palpable Not palpable  DP Palpable Faintly palpable    Gastrointestinal soft, non-distended, non-tender to palpation, No guarding or rebound, no HSM, no masses, no CVAT B, No palpable prominent aortic pulse,    Musculoskeletal M/S 5/5 throughout  , Extremities without ischemic changes  , No edema present, no hematoma L groin  Neurologic  Pain and light touch intact in extremities , Motor exam as listed above    Medical Decision Making   Traci Raymond is a 66 y.o.  female who presents  s/p prior procedure including:  1.  PTA+S R EIA x 2, PTA R CFA and proximal fem-pop anastomosis, PTA R PFA (05/25/17) 2.  R calf exploration and washout, VAC placement (10/17/15) 3.  R CFA to BK pop BPG w/ Propaten, R 2nd toe amp  After reviewing the angiogram, there was marked improvement in R iliac arterial flow and proximal bypass flow on completion imaging. Based on his angiographic findings, this patient needs: q3 month Aortoiliac duplex, RLE arterial duplex, and BLE ABI I discussed in depth with the patient the nature of atherosclerosis, and emphasized the importance of maximal medical management including strict control of blood pressure, blood glucose, and lipid levels, obtaining regular exercise, and cessation of smoking.  The  patient is aware that without maximal medical management the underlying atherosclerotic disease process will progress, limiting the benefit of any interventions. The patient is currently on a statin: Crestor.  The patient is currently on an anti-platelet: ASA and Plavix.  Thank you for allowing [place] to participate in this patient's care.   Adele Barthel, MD, FACS [facility] [place] Office: [phone] Pager: [phone]

## 2017-06-23 ENCOUNTER — Other Ambulatory Visit: Payer: Self-pay

## 2017-06-23 ENCOUNTER — Ambulatory Visit (INDEPENDENT_AMBULATORY_CARE_PROVIDER_SITE_OTHER): Payer: Medicare Other | Admitting: Vascular Surgery

## 2017-06-23 ENCOUNTER — Encounter: Payer: Self-pay | Admitting: Vascular Surgery

## 2017-06-23 VITALS — BP 123/66 | HR 79 | Temp 99.5°F | Resp 14 | Ht 62.0 in | Wt 190.0 lb

## 2017-06-23 DIAGNOSIS — I70261 Atherosclerosis of native arteries of extremities with gangrene, right leg: Secondary | ICD-10-CM

## 2017-06-26 NOTE — Addendum Note (Signed)
Addended by: Lianne Cure A on: 06/26/2017 04:13 PM   Modules accepted: Orders

## 2017-08-29 IMAGING — CT CT HEAD W/O CM
1 of 2 series · 15 of 30 positions shown, 19 images · non-contrast
Comparison: None.

CLINICAL DATA: 64-year-old who underwent left femoral popliteal
bypass graft yesterday who had an episode of acute mental status
change with confusion while at physical therapy at approximately 2
o'clock p.m. this afternoon. Patient only oriented to person at that
time. Current history of hypertension, diabetes and hyperlipidemia.

EXAM:
CT HEAD WITHOUT CONTRAST
TECHNIQUE: Contiguous axial images were obtained from the base of the skull
through the vertex without intravenous contrast.

[Series 3: head 2.0 h70h · axial · 0.44mm/px · z∈[-180,-52]mm · 15 of 72 slices shown, 19 images]
[im 4/72  brain]
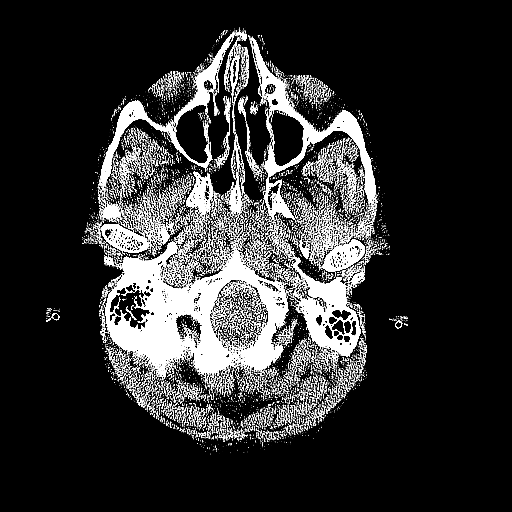
[im 4/72  bone]
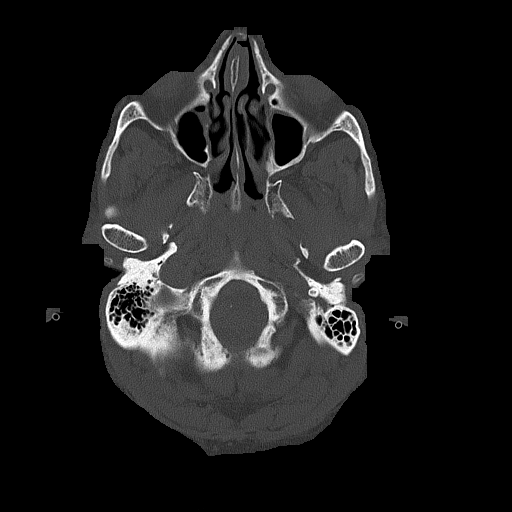
[im 8/72  brain]
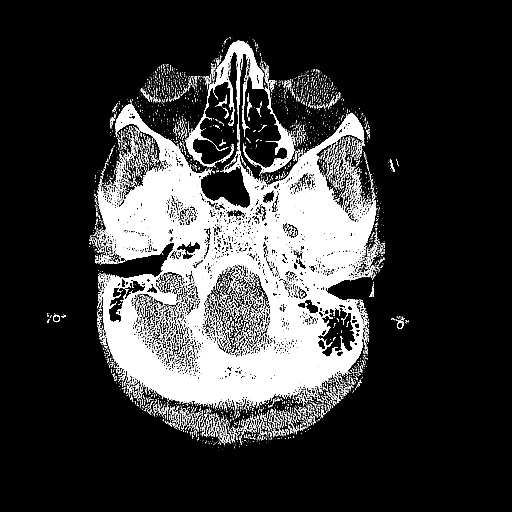
[im 15/72  brain]
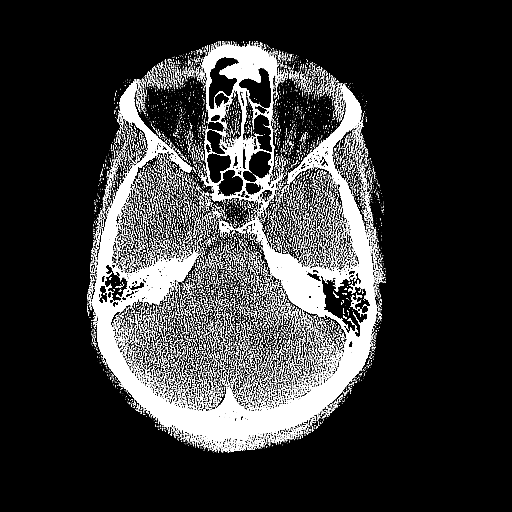
[im 18/72  brain]
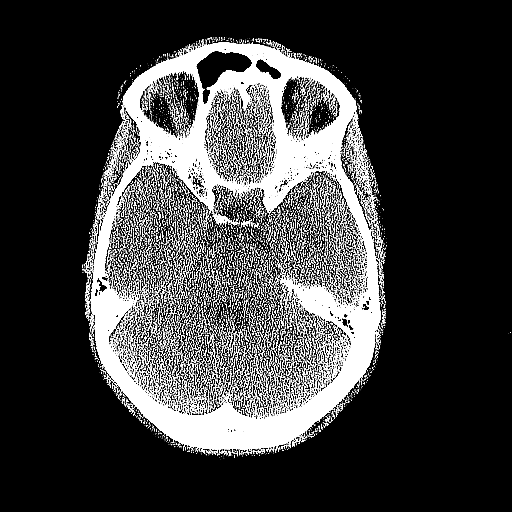
[im 22/72  brain]
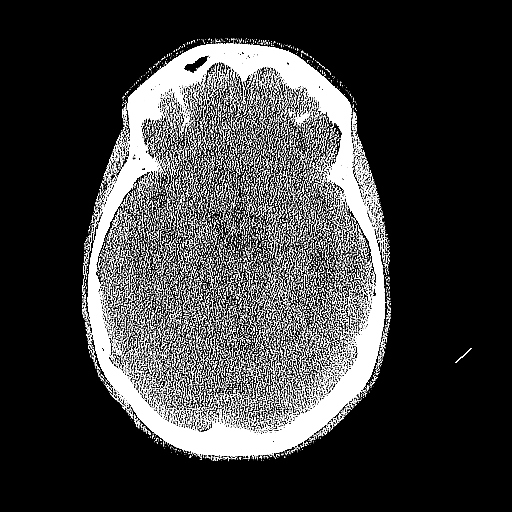
[im 22/72  bone]
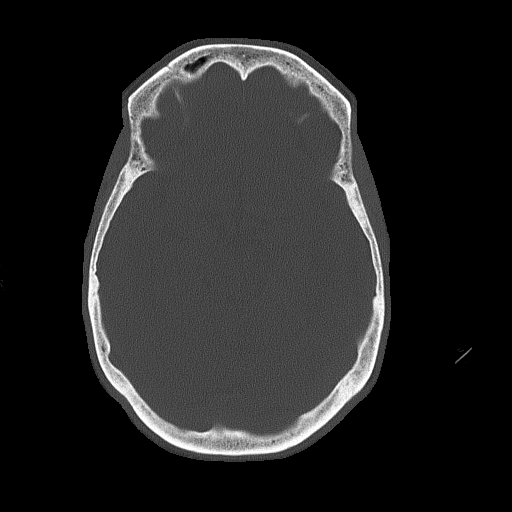
[im 25/72  brain]
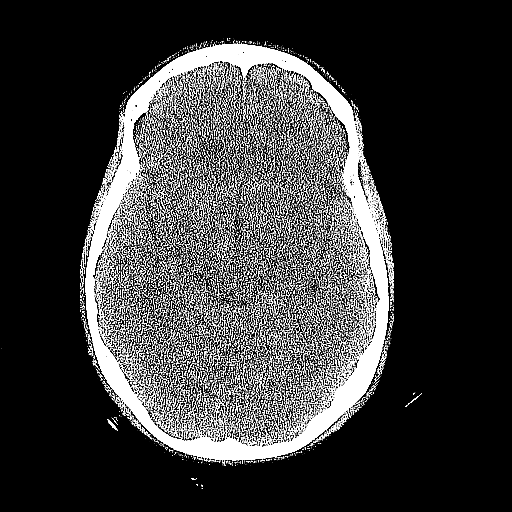
[im 32/72  brain]
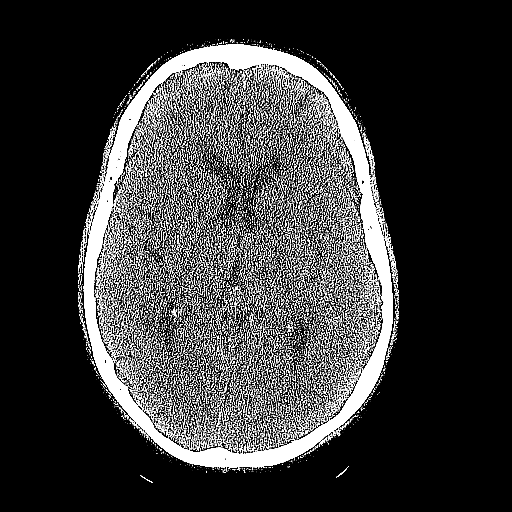
[im 36/72  brain]
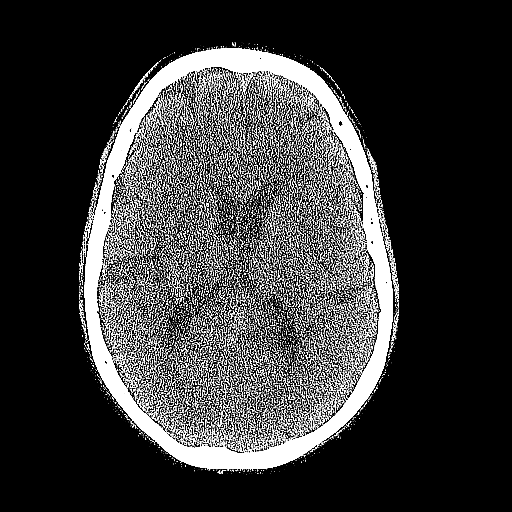
[im 40/72  brain]
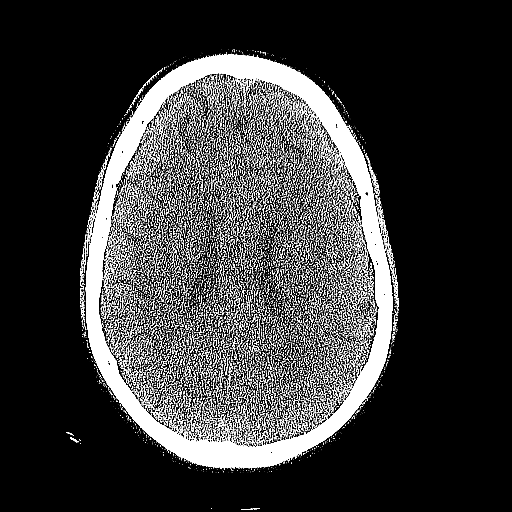
[im 40/72  bone]
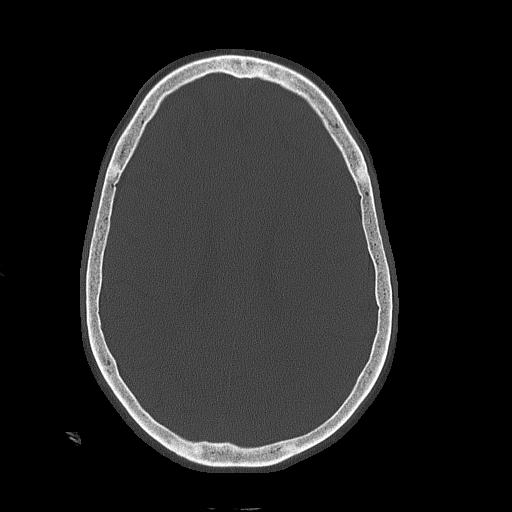
[im 47/72  brain]
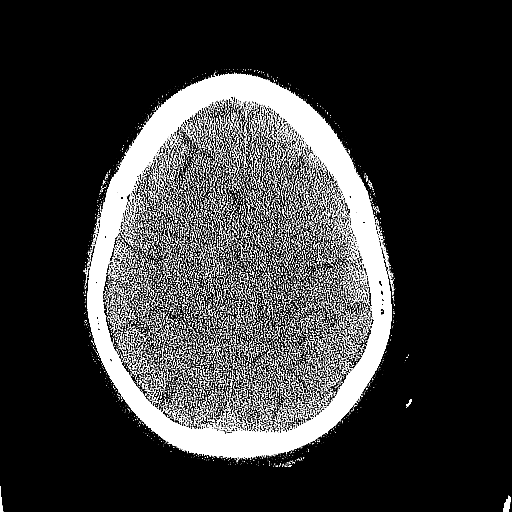
[im 50/72  brain]
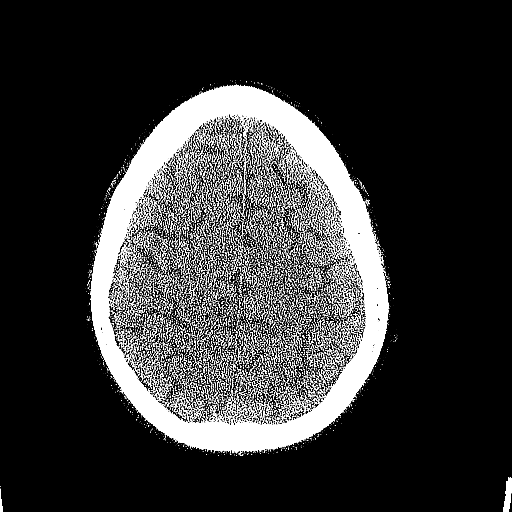
[im 54/72  brain]
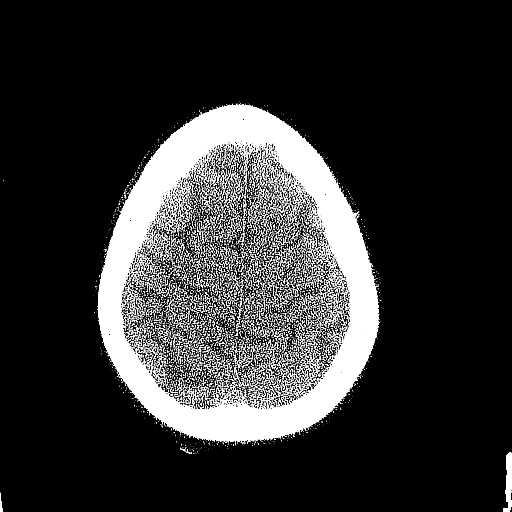
[im 57/72  brain]
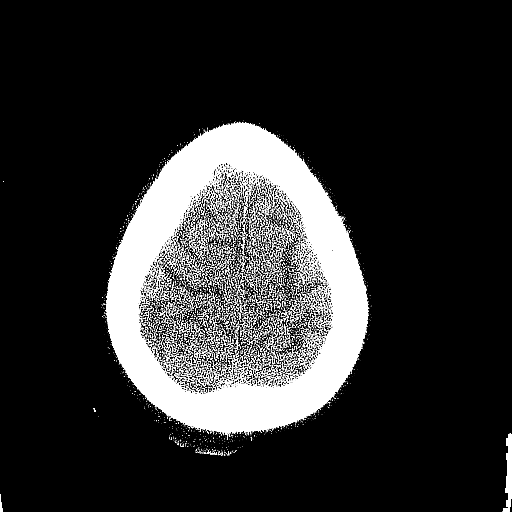
[im 57/72  bone]
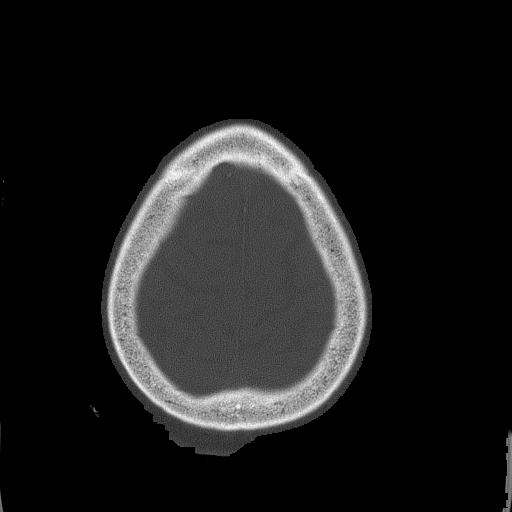
[im 64/72  brain]
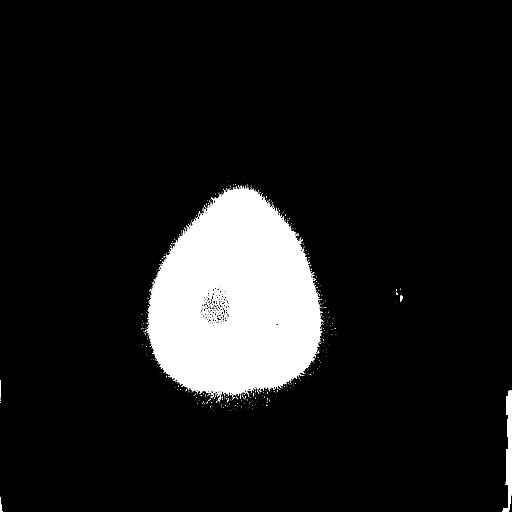
[im 68/72  brain]
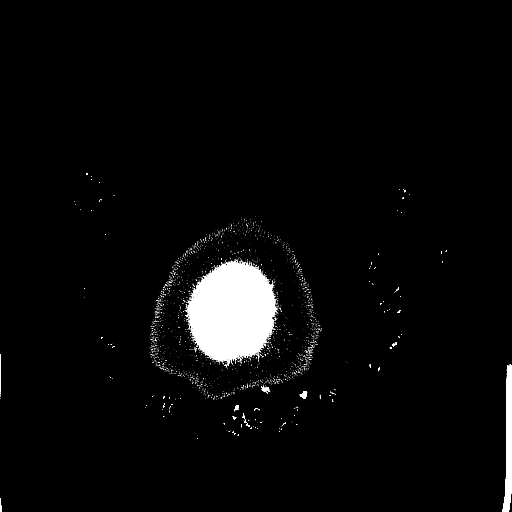

[15 of 30 positions shown; findings below may reference images not displayed]

FINDINGS: Ventricular system normal in size and appearance for age. No
significant atrophy. Minimal changes of small vessel disease of the
white matter. No mass lesion. No midline shift. No acute hemorrhage
or hematoma. No extra-axial fluid collections. No evidence of acute
infarction. No focal brain parenchymal abnormality.

No skull fracture or other focal osseous abnormality involving the
skull. Minimal mucosal thickening involving the maxillary sinuses.
Remaining visualized mastoid air cells, bilateral mastoid air cells
and bilateral middle ear cavities well-aerated. Moderate bilateral
carotid siphon and severe right vertebral artery atherosclerosis.
IMPRESSION: 1. No acute intracranial abnormality.
2. Minimal chronic microvascular ischemic changes of the white
matter.

## 2017-10-12 ENCOUNTER — Encounter: Payer: Self-pay | Admitting: Cardiology

## 2017-10-16 NOTE — Progress Notes (Deleted)
Postoperative Visit (Angio)   History of Present Illness   Traci Raymond is a 67 y.o. (Jun 24, 1951) female who presents cc:  ***.  Prior procedure include: 1.  PTA+S R EIA x 2, PTA R CFA and proximal fem-pop anastomosis, PTA R PFA (05/25/17) 2.  R calf exploration and washout, VAC placement (10/17/15) 3.  R CFA to BK pop BPG w/ Propaten, R 2nd toe amp  The patient's wounds are healed.  The patient notes persistent completionresolution of lower extremity symptoms.  The patient is able to complete their activities of daily living.  The patient's current symptoms are: none.  Past Medical History, Past Surgical History, Social History, Family History, Medications, Allergies, and Review of Systems are unchanged from previous evaluation on 06/23/17  Current Outpatient Medications  Medication Sig Dispense Refill  . amLODipine (NORVASC) 10 MG tablet Take 10 mg by mouth daily.    Marland Kitchen aspirin EC 81 MG tablet Take 81 mg by mouth every morning.     . cholecalciferol (VITAMIN D) 1000 units tablet Take 1,000 Units by mouth every morning.     . clopidogrel (PLAVIX) 75 MG tablet Take 75 mg by mouth every morning.     . ferrous sulfate 325 (65 FE) MG tablet Take 325 mg by mouth daily with breakfast.    . insulin aspart (NOVOLOG FLEXPEN) 100 UNIT/ML FlexPen Inject 30 Units into the skin 2 (two) times daily with a meal.     . LEVEMIR FLEXTOUCH 100 UNIT/ML Pen Inject 30 Units into the skin at bedtime.     Marland Kitchen lisinopril (PRINIVIL,ZESTRIL) 20 MG tablet Take 20 mg by mouth daily.    . metFORMIN (GLUCOPHAGE) 1000 MG tablet Take 1,000 mg by mouth 2 (two) times daily with a meal.     . omega-3 acid ethyl esters (LOVAZA) 1 g capsule Take 1 g by mouth every morning.     . rosuvastatin (CRESTOR) 40 MG tablet Take 40 mg by mouth every morning.     . sitaGLIPtin (JANUVIA) 100 MG tablet Take 100 mg by mouth daily.     No current facility-administered medications for this visit.     ROS: ***,  ***   Physical Examination  ***There were no vitals filed for this visit. ***There is no height or weight on file to calculate BMI.   General Alert, O x 3, WD, NAD  Pulmonary Sym exp, good B air movt, CTA B  Cardiac RRR, Nl S1, S2, no Murmurs, No rubs, No S3,S4  Vascular Vessel Right Left  Radial Palpable Palpable  Brachial Palpable Palpable  Carotid Palpable, No Bruit Palpable, No Bruit  Aorta Not palpable N/A  Femoral Palpable Palpable  Popliteal Not palpable Not palpable  PT Faintly palpable Not palpable  DP Palpable Faintly palpable    Gastrointestinal soft, non-distended, non-tender to palpation, No guarding or rebound, no HSM, no masses, no CVAT B, No palpable prominent aortic pulse,    Musculoskeletal M/S 5/5 throughout  , Extremities without ischemic changes  , No edema present, no hematoma L groin  Neurologic  Pain and light touch intact in extremities , Motor exam as listed above    Medical Decision Making   Traci Raymond is a 67 y.o. ([birth year]) female who presents  s/p prior procedure including:  1.  PTA+S R EIA x 2, PTA R CFA and proximal fem-pop anastomosis, PTA R PFA (05/25/17) 2.  R calf exploration and washout, VAC placement (10/17/15) 3.  R CFA to BK  pop BPG w/ Propaten, R 2nd toe amp   ***  Based on his angiographic findings, this patient needs: q3 month Aortoiliac duplex, RLE arterial duplex, and BLE ABI  I discussed in depth with the patient the nature of atherosclerosis, and emphasized the importance of maximal medical management including strict control of blood pressure, blood glucose, and lipid levels, obtaining regular exercise, and cessation of smoking.  The patient is aware that without maximal medical management the underlying atherosclerotic disease process will progress, limiting the benefit of any interventions.  The patient is currently on a statin: Crestor.   The patient is currently on an anti-platelet: ASA and Plavix.  Thank  you for allowing [place] to participate in this patient's care.   Adele Barthel, MD, FACS [facility] Office: [phone] Pager: [phone]

## 2017-10-18 ENCOUNTER — Encounter (HOSPITAL_COMMUNITY): Payer: Self-pay

## 2017-10-18 ENCOUNTER — Ambulatory Visit: Payer: Self-pay | Admitting: Vascular Surgery

## 2017-12-12 NOTE — Progress Notes (Signed)
Established Previous Bypass   History of Present Illness   Traci Raymond is a 67 y.o. (1951/07/02) female who presents with chief complaint: right foot aching at 2nd toe amp.    Prior procedures include: 1. PTA+S R EIA x 2, PTA R CFA and proximal fem-pop anastomosis, PTA R PFA (05/25/17) 2. R calf exploration and washout, VAC placement (10/17/15) 3. R CFA to BK pop BPG w/ Propaten, R 2nd toe amp  The patient's symptoms have not progressed.  The patient's symptoms are: occasional aching at 2nd toe amp. The patient's treatment regimen currently included: maximal medical management.  The patient's PMH, PSH, SH, and FamHx were reviewed on 12/13/17 are unchanged from 06/23/17.  Current Outpatient Medications  Medication Sig Dispense Refill  . amLODipine (NORVASC) 10 MG tablet Take 10 mg by mouth daily.    Marland Kitchen aspirin EC 81 MG tablet Take 81 mg by mouth every morning.     . cholecalciferol (VITAMIN D) 1000 units tablet Take 1,000 Units by mouth every morning.     . clopidogrel (PLAVIX) 75 MG tablet Take 75 mg by mouth every morning.     . ferrous sulfate 325 (65 FE) MG tablet Take 325 mg by mouth daily with breakfast.    . insulin aspart (NOVOLOG FLEXPEN) 100 UNIT/ML FlexPen Inject 30 Units into the skin 2 (two) times daily with a meal.     . LEVEMIR FLEXTOUCH 100 UNIT/ML Pen Inject 30 Units into the skin at bedtime.     Marland Kitchen lisinopril (PRINIVIL,ZESTRIL) 20 MG tablet Take 20 mg by mouth daily.    . metFORMIN (GLUCOPHAGE) 1000 MG tablet Take 1,000 mg by mouth 2 (two) times daily with a meal.     . omega-3 acid ethyl esters (LOVAZA) 1 g capsule Take 1 g by mouth every morning.     . rosuvastatin (CRESTOR) 40 MG tablet Take 40 mg by mouth every morning.     . sitaGLIPtin (JANUVIA) 100 MG tablet Take 100 mg by mouth daily.     No current facility-administered medications for this visit.     On ROS today: no new ulcers, no intermittent claudication    Physical Examination   Vitals:    12/13/17 0909 12/13/17 0910  BP: (!) 155/74 (!) 151/72  Pulse: 76   Resp: 18   SpO2: 99%   Weight: 191 lb (86.6 kg)   Height: 5\' 2"  (1.575 m)    Body mass index is 34.93 kg/m.  General Alert, O x 3, WD, NAD  Pulmonary Sym exp, good B air movt, CTA B  Cardiac RRR, Nl S1, S2, no Murmurs, No rubs, No S3,S4  Vascular Vessel Right Left  Radial Palpable Palpable  Brachial Palpable Palpable  Carotid Palpable, No Bruit Palpable, No Bruit  Aorta Not palpable N/A  Femoral Palpable Palpable  Popliteal Not palpable Not palpable  PT Not palpable Not palpable  DP Not palpable Not palpable    Gastro- intestinal soft, non-distended, non-tender to palpation, No guarding or rebound, no HSM, no masses, no CVAT B, No palpable prominent aortic pulse,    Musculo- skeletal M/S 5/5 throughout  , Extremities without ischemic changes except well healed R 2nd toe amputation, calluses on both feet , No edema present, No visible varicosities , No Lipodermatosclerosis present  Neurologic Pain and light touch intact in extremities , Motor exam as listed above    Non-invasive Vascular Imaging   ABI (12/13/2017)  R:   ABI: 0.54 (0.93),  PT: mono  DP: mono  TBI:  0.50  L:   ABI: 0.66 (0.70),   PT: mono  DP: mono  TBI: 0.63  Aortoiliac Duplex (12/13/2017)  Poor visualization  Ao: 82 c/s  R iliac: 93-513 c/s  RLE Bypass Duplex (12/13/2017 )  Proximal to bypass: 92 c/s  With-in bypass: 41-143 c/s  Distal to bypass: 28 c/s   Medical Decision Making   Traci Raymond is a 67 y.o. 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   Marked deterioration in RLE ABI and 513 c/s PSV in R iliac stent suggests significant in-stent restenosis.  I recommended: Aortogram, pelvic angiogram, and possible  intervention R iliac artery. I discussed with the patient the nature of angiographic procedures, especially the limited patencies of any endovascular intervention.   The patient is aware of that the risks of an angiographic procedure include but are not limited to: bleeding, infection, access site complications, renal failure, embolization, rupture of vessel, dissection, arteriovenous fistula, possible need for emergent surgical intervention, possible need for surgical procedures to treat the patient's pathology, anaphylactic reaction to contrast, and stroke and death.   The patient is aware of the risks and agrees to proceed.  She is scheduled 16 APR 19.  I discussed in depth with the patient the nature of atherosclerosis, and emphasized the importance of maximal medical management including strict control of blood pressure, blood glucose, and lipid levels, obtaining regular exercise, and cessation of smoking.    The patient is aware that without maximal medical management the underlying atherosclerotic disease process will progress, limiting the benefit of any interventions.  I discussed in depth with the patient the need for long term surveillance to improve the primary assisted patency of his bypass.  The patient agrees to cooperate with such.  The patient is currently on a statin: Crestor.   The patient is currently on an anti-platelet: ASA and Plavix.  Thank you for allowing [place] to participate in this patient's care.   Adele Barthel, MD, FACS [facility] Office: [phone] Pager: [phone]

## 2017-12-12 NOTE — H&P (View-Only) (Signed)
Established Previous Bypass   History of Present Illness   Traci Raymond is a 67 y.o. ([birth year]) female who presents with chief complaint: right foot aching at 2nd toe amp.    Prior procedures include: 1. PTA+S R EIA x 2, PTA R CFA and proximal fem-pop anastomosis, PTA R PFA (05/25/17) 2. R calf exploration and washout, VAC placement (10/17/15) 3. R CFA to BK pop BPG w/ Propaten, R 2nd toe amp  The patient's symptoms have not progressed.  The patient's symptoms are: occasional aching at 2nd toe amp. The patient's treatment regimen currently included: maximal medical management.  The patient's PMH, PSH, SH, and FamHx were reviewed on 12/13/17 are unchanged from 06/23/17.  Current Outpatient Medications  Medication Sig Dispense Refill  . amLODipine (NORVASC) 10 MG tablet Take 10 mg by mouth daily.    Marland Kitchen aspirin EC 81 MG tablet Take 81 mg by mouth every morning.     . cholecalciferol (VITAMIN D) 1000 units tablet Take 1,000 Units by mouth every morning.     . clopidogrel (PLAVIX) 75 MG tablet Take 75 mg by mouth every morning.     . ferrous sulfate 325 (65 FE) MG tablet Take 325 mg by mouth daily with breakfast.    . insulin aspart (NOVOLOG FLEXPEN) 100 UNIT/ML FlexPen Inject 30 Units into the skin 2 (two) times daily with a meal.     . LEVEMIR FLEXTOUCH 100 UNIT/ML Pen Inject 30 Units into the skin at bedtime.     Marland Kitchen lisinopril (PRINIVIL,ZESTRIL) 20 MG tablet Take 20 mg by mouth daily.    . metFORMIN (GLUCOPHAGE) 1000 MG tablet Take 1,000 mg by mouth 2 (two) times daily with a meal.     . omega-3 acid ethyl esters (LOVAZA) 1 g capsule Take 1 g by mouth every morning.     . rosuvastatin (CRESTOR) 40 MG tablet Take 40 mg by mouth every morning.     . sitaGLIPtin (JANUVIA) 100 MG tablet Take 100 mg by mouth daily.     No current facility-administered medications for this visit.     On ROS today: no new ulcers, no intermittent claudication    Physical Examination   Vitals:    12/13/17 0909 12/13/17 0910  BP: (!) 155/74 (!) 151/72  Pulse: 76   Resp: 18   SpO2: 99%   Weight: 191 lb (86.6 kg)   Height: 5\' 2"  (1.575 m)    Body mass index is 34.93 kg/m.  General Alert, O x 3, WD, NAD  Pulmonary Sym exp, good B air movt, CTA B  Cardiac RRR, Nl S1, S2, no Murmurs, No rubs, No S3,S4  Vascular Vessel Right Left  Radial Palpable Palpable  Brachial Palpable Palpable  Carotid Palpable, No Bruit Palpable, No Bruit  Aorta Not palpable N/A  Femoral Palpable Palpable  Popliteal Not palpable Not palpable  PT Not palpable Not palpable  DP Not palpable Not palpable    Gastro- intestinal soft, non-distended, non-tender to palpation, No guarding or rebound, no HSM, no masses, no CVAT B, No palpable prominent aortic pulse,    Musculo- skeletal M/S 5/5 throughout  , Extremities without ischemic changes except well healed R 2nd toe amputation, calluses on both feet , No edema present, No visible varicosities , No Lipodermatosclerosis present  Neurologic Pain and light touch intact in extremities , Motor exam as listed above    Non-invasive Vascular Imaging   ABI (12/13/2017)  R:   ABI: 0.54 (0.93),  PT: mono  DP: mono  TBI:  0.50  L:   ABI: 0.66 (0.70),   PT: mono  DP: mono  TBI: 0.63  Aortoiliac Duplex (12/13/2017)  Poor visualization  Ao: 82 c/s  R iliac: 93-513 c/s  RLE Bypass Duplex (12/13/2017 )  Proximal to bypass: 92 c/s  With-in bypass: 41-143 c/s  Distal to bypass: 28 c/s   Medical Decision Making   Traci Raymond is a 67 y.o. 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   Marked deterioration in RLE ABI and 513 c/s PSV in R iliac stent suggests significant in-stent restenosis.  I recommended: Aortogram, pelvic angiogram, and possible  intervention R iliac artery. I discussed with the patient the nature of angiographic procedures, especially the limited patencies of any endovascular intervention.   The patient is aware of that the risks of an angiographic procedure include but are not limited to: bleeding, infection, access site complications, renal failure, embolization, rupture of vessel, dissection, arteriovenous fistula, possible need for emergent surgical intervention, possible need for surgical procedures to treat the patient's pathology, anaphylactic reaction to contrast, and stroke and death.   The patient is aware of the risks and agrees to proceed.  She is scheduled 16 APR 19.  I discussed in depth with the patient the nature of atherosclerosis, and emphasized the importance of maximal medical management including strict control of blood pressure, blood glucose, and lipid levels, obtaining regular exercise, and cessation of smoking.    The patient is aware that without maximal medical management the underlying atherosclerotic disease process will progress, limiting the benefit of any interventions.  I discussed in depth with the patient the need for long term surveillance to improve the primary assisted patency of his bypass.  The patient agrees to cooperate with such.  The patient is currently on a statin: Crestor.   The patient is currently on an anti-platelet: ASA and Plavix.  Thank you for allowing [place] to participate in this patient's care.   Adele Barthel, MD, FACS [facility] Office: [phone] Pager: [phone]

## 2017-12-13 ENCOUNTER — Ambulatory Visit (HOSPITAL_COMMUNITY)
Admission: RE | Admit: 2017-12-13 | Discharge: 2017-12-13 | Disposition: A | Discharge status: Home / Self Care | Payer: Medicare Other | Source: Ambulatory Visit | Attending: Vascular Surgery | Admitting: Vascular Surgery

## 2017-12-13 ENCOUNTER — Other Ambulatory Visit: Payer: Self-pay

## 2017-12-13 ENCOUNTER — Ambulatory Visit (INDEPENDENT_AMBULATORY_CARE_PROVIDER_SITE_OTHER)
Admission: RE | Admit: 2017-12-13 | Discharge: 2017-12-13 | Disposition: A | Discharge status: Home / Self Care | Payer: Medicare Other | Source: Ambulatory Visit | Attending: Vascular Surgery | Admitting: Vascular Surgery

## 2017-12-13 ENCOUNTER — Other Ambulatory Visit: Payer: Self-pay | Admitting: *Deleted

## 2017-12-13 ENCOUNTER — Ambulatory Visit (INDEPENDENT_AMBULATORY_CARE_PROVIDER_SITE_OTHER): Payer: Medicare Other | Admitting: Vascular Surgery

## 2017-12-13 ENCOUNTER — Encounter: Payer: Self-pay | Admitting: Vascular Surgery

## 2017-12-13 ENCOUNTER — Encounter: Payer: Self-pay | Admitting: *Deleted

## 2017-12-13 VITALS — BP 151/72 | HR 76 | Resp 18 | Ht 62.0 in | Wt 191.0 lb

## 2017-12-13 DIAGNOSIS — I70261 Atherosclerosis of native arteries of extremities with gangrene, right leg: Secondary | ICD-10-CM | POA: Diagnosis not present

## 2017-12-13 DIAGNOSIS — R0989 Other specified symptoms and signs involving the circulatory and respiratory systems: Secondary | ICD-10-CM | POA: Diagnosis present

## 2017-12-13 DIAGNOSIS — I708 Atherosclerosis of other arteries: Secondary | ICD-10-CM | POA: Diagnosis not present

## 2017-12-13 DIAGNOSIS — R9389 Abnormal findings on diagnostic imaging of other specified body structures: Secondary | ICD-10-CM | POA: Diagnosis not present

## 2017-12-13 NOTE — Progress Notes (Signed)
Reviewed pre-procedure instructions with patient. She states she takes Metformin in AM ONLY. Instructed to take 1/2 pm dose of insulin night prior and Hold Metformin and insulin am of procedure. As instructed in letter. To stay on ASA and Plavix.

## 2017-12-21 ENCOUNTER — Telehealth: Payer: Self-pay | Admitting: Vascular Surgery

## 2017-12-21 ENCOUNTER — Encounter (HOSPITAL_COMMUNITY)
Admission: RE | Disposition: A | Discharge status: Home / Self Care | Payer: Self-pay | Source: Ambulatory Visit | Attending: Vascular Surgery

## 2017-12-21 ENCOUNTER — Ambulatory Visit (HOSPITAL_COMMUNITY)
Admission: RE | Admit: 2017-12-21 | Discharge: 2017-12-21 | Disposition: A | Discharge status: Home / Self Care | Payer: Medicare Other | Source: Ambulatory Visit | Attending: Vascular Surgery | Admitting: Vascular Surgery

## 2017-12-21 DIAGNOSIS — Z7902 Long term (current) use of antithrombotics/antiplatelets: Secondary | ICD-10-CM | POA: Diagnosis not present

## 2017-12-21 DIAGNOSIS — Z89421 Acquired absence of other right toe(s): Secondary | ICD-10-CM | POA: Insufficient documentation

## 2017-12-21 DIAGNOSIS — T82856A Stenosis of peripheral vascular stent, initial encounter: Secondary | ICD-10-CM | POA: Diagnosis not present

## 2017-12-21 DIAGNOSIS — I70269 Atherosclerosis of native arteries of extremities with gangrene, unspecified extremity: Secondary | ICD-10-CM | POA: Diagnosis present

## 2017-12-21 DIAGNOSIS — Z7982 Long term (current) use of aspirin: Secondary | ICD-10-CM | POA: Diagnosis not present

## 2017-12-21 DIAGNOSIS — Y831 Surgical operation with implant of artificial internal device as the cause of abnormal reaction of the patient, or of later complication, without mention of misadventure at the time of the procedure: Secondary | ICD-10-CM | POA: Insufficient documentation

## 2017-12-21 DIAGNOSIS — I70211 Atherosclerosis of native arteries of extremities with intermittent claudication, right leg: Secondary | ICD-10-CM | POA: Insufficient documentation

## 2017-12-21 HISTORY — PX: ABDOMINAL AORTOGRAM W/LOWER EXTREMITY: CATH118223

## 2017-12-21 HISTORY — PX: PERIPHERAL VASCULAR INTERVENTION: CATH118257

## 2017-12-21 LAB — POCT ACTIVATED CLOTTING TIME
Activated Clotting Time: 186 seconds
Activated Clotting Time: 202 seconds

## 2017-12-21 LAB — POCT I-STAT, CHEM 8
BUN: 9 mg/dL (ref 6–20)
CREATININE: 1.2 mg/dL — AB (ref 0.44–1.00)
Calcium, Ion: 1.31 mmol/L (ref 1.15–1.40)
Chloride: 107 mmol/L (ref 101–111)
Glucose, Bld: 159 mg/dL — ABNORMAL HIGH (ref 65–99)
HCT: 33 % — ABNORMAL LOW (ref 36.0–46.0)
Hemoglobin: 11.2 g/dL — ABNORMAL LOW (ref 12.0–15.0)
POTASSIUM: 3.7 mmol/L (ref 3.5–5.1)
SODIUM: 141 mmol/L (ref 135–145)
TCO2: 23 mmol/L (ref 22–32)

## 2017-12-21 LAB — GLUCOSE, CAPILLARY: Glucose-Capillary: 161 mg/dL — ABNORMAL HIGH (ref 65–99)

## 2017-12-21 SURGERY — ABDOMINAL AORTOGRAM W/LOWER EXTREMITY
Anesthesia: LOCAL | Laterality: Right

## 2017-12-21 MED ORDER — IODIXANOL 320 MG/ML IV SOLN
INTRAVENOUS | Status: DC | PRN
  Administered 2017-12-21: 120 mL via INTRA_ARTERIAL

## 2017-12-21 MED ORDER — HEPARIN (PORCINE) IN NACL 2-0.9 UNITS/ML
INTRAMUSCULAR | Status: AC | PRN
  Administered 2017-12-21 (×2): 500 mL via INTRA_ARTERIAL

## 2017-12-21 MED ORDER — SODIUM CHLORIDE 0.9% FLUSH: 3.0000 mL | INTRAVENOUS | Status: DC | PRN

## 2017-12-21 MED ORDER — SODIUM CHLORIDE 0.9% FLUSH: 3.0000 mL | Freq: Two times a day (BID) | INTRAVENOUS | Status: DC

## 2017-12-21 MED ORDER — FENTANYL CITRATE (PF) 100 MCG/2ML IJ SOLN
INTRAMUSCULAR | Status: AC
  Filled 2017-12-21: qty 2

## 2017-12-21 MED ORDER — LIDOCAINE HCL (PF) 1 % IJ SOLN
INTRAMUSCULAR | Status: AC
  Filled 2017-12-21: qty 30

## 2017-12-21 MED ORDER — ACETAMINOPHEN 325 MG PO TABS: 650.0000 mg | ORAL_TABLET | ORAL | Status: DC | PRN

## 2017-12-21 MED ORDER — LABETALOL HCL 5 MG/ML IV SOLN: 10.0000 mg | INTRAVENOUS | Status: DC | PRN

## 2017-12-21 MED ORDER — HEPARIN (PORCINE) IN NACL 1000-0.9 UT/500ML-% IV SOLN
INTRAVENOUS | Status: AC
  Filled 2017-12-21: qty 1000

## 2017-12-21 MED ORDER — HEPARIN SODIUM (PORCINE) 1000 UNIT/ML IJ SOLN
INTRAMUSCULAR | Status: AC
  Filled 2017-12-21: qty 1

## 2017-12-21 MED ORDER — HYDRALAZINE HCL 20 MG/ML IJ SOLN
5.0000 mg | INTRAMUSCULAR | Status: AC | PRN
  Administered 2017-12-21 (×2): 5 mg via INTRAVENOUS

## 2017-12-21 MED ORDER — FENTANYL CITRATE (PF) 100 MCG/2ML IJ SOLN
INTRAMUSCULAR | Status: DC | PRN
  Administered 2017-12-21 (×2): 25 ug via INTRAVENOUS

## 2017-12-21 MED ORDER — HEPARIN SODIUM (PORCINE) 1000 UNIT/ML IJ SOLN
INTRAMUSCULAR | Status: DC | PRN
  Administered 2017-12-21: 9500 [IU] via INTRAVENOUS

## 2017-12-21 MED ORDER — SODIUM CHLORIDE 0.9 % WEIGHT BASED INFUSION: 1.0000 mL/kg/h | INTRAVENOUS | Status: DC

## 2017-12-21 MED ORDER — MIDAZOLAM HCL 2 MG/2ML IJ SOLN
INTRAMUSCULAR | Status: AC
  Filled 2017-12-21: qty 2

## 2017-12-21 MED ORDER — ONDANSETRON HCL 4 MG/2ML IJ SOLN: 4.0000 mg | Freq: Four times a day (QID) | INTRAMUSCULAR | Status: DC | PRN

## 2017-12-21 MED ORDER — HYDRALAZINE HCL 20 MG/ML IJ SOLN
INTRAMUSCULAR | Status: AC
  Filled 2017-12-21: qty 1

## 2017-12-21 MED ORDER — MIDAZOLAM HCL 2 MG/2ML IJ SOLN
INTRAMUSCULAR | Status: DC | PRN
  Administered 2017-12-21 (×2): 0.5 mg via INTRAVENOUS

## 2017-12-21 MED ORDER — NITROGLYCERIN 1 MG/10 ML FOR IR/CATH LAB
INTRA_ARTERIAL | Status: DC | PRN
  Administered 2017-12-21: 200 ug via INTRA_ARTERIAL

## 2017-12-21 MED ORDER — SODIUM CHLORIDE 0.9 % IV SOLN: 250.0000 mL | INTRAVENOUS | Status: DC | PRN

## 2017-12-21 MED ORDER — SODIUM CHLORIDE 0.9 % IV SOLN
INTRAVENOUS | Status: DC
  Administered 2017-12-21: 06:00:00 via INTRAVENOUS

## 2017-12-21 MED ORDER — LIDOCAINE HCL (PF) 1 % IJ SOLN
INTRAMUSCULAR | Status: DC | PRN
  Administered 2017-12-21: 20 mL via INTRADERMAL

## 2017-12-21 MED ORDER — NITROGLYCERIN 1 MG/10 ML FOR IR/CATH LAB
INTRA_ARTERIAL | Status: AC
  Filled 2017-12-21: qty 10

## 2017-12-21 SURGICAL SUPPLY — 25 items
BALLN LUTONIX AV 8X60X75 (BALLOONS) ×3
BALLN LUTONIX DCB 5X40X130 (BALLOONS) ×3
BALLN MUSTANG 5X20X135 (BALLOONS) ×3
BALLN MUSTANG 6.0X40 75 (BALLOONS) ×3
BALLOON LUTONIX AV 8X60X75 (BALLOONS) ×2 IMPLANT
BALLOON LUTONIX DCB 5X40X130 (BALLOONS) ×2 IMPLANT
BALLOON MUSTANG 5X20X135 (BALLOONS) ×2 IMPLANT
BALLOON MUSTANG 6.0X40 75 (BALLOONS) ×2 IMPLANT
CATH OMNI FLUSH 5F 65CM (CATHETERS) ×3 IMPLANT
CATH STRAIGHT 5FR 65CM (CATHETERS) ×3 IMPLANT
COVER PRB 48X5XTLSCP FOLD TPE (BAG) ×2 IMPLANT
COVER PROBE 5X48 (BAG) ×1
DEVICE CONTINUOUS FLUSH (MISCELLANEOUS) ×3 IMPLANT
DEVICE TORQUE .025-.038 (MISCELLANEOUS) ×3 IMPLANT
GUIDEWIRE ANGLED .035X150CM (WIRE) ×6 IMPLANT
KIT ENCORE 26 ADVANTAGE (KITS) ×3 IMPLANT
KIT MICROPUNCTURE NIT STIFF (SHEATH) ×3 IMPLANT
KIT PV (KITS) ×3 IMPLANT
SHEATH PINNACLE 5F 10CM (SHEATH) ×3 IMPLANT
SHEATH PINNACLE ST 7F 45CM (SHEATH) ×3 IMPLANT
SYR MEDRAD MARK V 150ML (SYRINGE) ×3 IMPLANT
TRANSDUCER W/STOPCOCK (MISCELLANEOUS) ×3 IMPLANT
TRAY PV CATH (CUSTOM PROCEDURE TRAY) ×3 IMPLANT
WIRE BENTSON .035X145CM (WIRE) ×3 IMPLANT
WIRE ROSEN-J .035X260CM (WIRE) ×3 IMPLANT

## 2017-12-21 NOTE — Discharge Instructions (Signed)

## 2017-12-21 NOTE — Telephone Encounter (Signed)
-----   Message from Mena Goes, RN sent at 12/21/2017 10:03 AM EDT ----- Regarding: 4-6 weeks   ----- Message ----- From: Conrad Hunt, MD Sent: 12/21/2017   9:28 AM To: [facility] [address]  Traci Raymond [id number] 14-Oct-1950  PROCEDURE: 1.  Left common femoral artery cannulation under ultrasound guidance 2.  Placement of catheter in aorta 3.  Aortogram 4.  Second order arterial selection 5.  Bland angioplasty of right external iliac artery (6 mm x 40 mm) 6.  Drug coated angioplasty of right external iliac artery (6 mm x 60 mm) 7.  Right leg runoff 8.  Bland angioplasty of right common femoral artery and bypass (5 mm x 20 mm) 9.  Drug coated angioplasty of right common femoral artery and bypass (5 mm x 40 mm) 10.  Conscious sedation for 64 minutes 11.  Intra-arterial administration of nitroglycerin  Follow-up: 4-6 weeks  Orders(s) for follow-up: RLE ABI

## 2017-12-21 NOTE — Progress Notes (Signed)
Site area: left groin fa sheath Site Prior to Removal:  Level 0 Pressure Applied For: 20 minutes Manual:   yes Patient Status During Pull:  stable Post Pull Site:  Level 0 Post Pull Instructions Given:  yes Post Pull Pulses Present: dopplered left pt Dressing Applied:  Gauze and tegaderm Bedrest begins @ 1050 Comments:

## 2017-12-21 NOTE — Progress Notes (Signed)
Pt was to be DC'd at 1450, daughter brought pt in this morning and was here post for an hour of the recover. Daughter // Ivin Booty said she had to go to work but one of the patients sisters would be here to pick her up. I asked her if they new to come 15 minutes early like around 1430. She verbalized yes they knew. At 1500 I asked the patient for a number for her caregiver/ ride. No answer at that number. She said she reached another family member and they would be here at 35. At 1600 the pt said I just want to go, I want to sit outside, Im leaving now. I tried to explain why we needed her to wait for the family member and she refused as she raised her voice to me. Pt signed leaving against medical advice and was wheeled to the lobby to wait for a ride in stable condition.

## 2017-12-21 NOTE — Op Note (Signed)
OPERATIVE NOTE   PROCEDURE: 1.  Left common femoral artery cannulation under ultrasound guidance 2.  Placement of catheter in aorta 3.  Aortogram 4.  Second order arterial selection 5.  Bland angioplasty of right external iliac artery (6 mm x 40 mm) 6.  Drug coated angioplasty of right external iliac artery (6 mm x 60 mm) 7.  Right leg runoff 8.  Bland angioplasty of right common femoral artery and bypass (5 mm x 20 mm) 9.  Drug coated angioplasty of right common femoral artery and bypass (5 mm x 40 mm) 10.  Conscious sedation for 64 minutes 11.  Intra-arterial administration of nitroglycerin  PRE-OPERATIVE DIAGNOSIS: recurrent right leg intermittent claudication, deteriorate in right foot perfusion  POST-OPERATIVE DIAGNOSIS: same as above   SURGEON: Adele Barthel, MD  ANESTHESIA: conscious sedation  ESTIMATED BLOOD LOSS: 50 cc  CONTRAST: 120 cc  FINDING(S):  Aorta: patent  Superior mesenteric artery: not visualized, meandering marginal artery Celiac artery: not seen   Right Left  RA patent patent  CIA patent patent  EIA Patent, in-stent restenosis in proximal stent: sub-total occlusion resolved with serial angioplasty, patent distal stent Patent, <50% stenosis  IIA Patent Patent  CFA Patent, diseased, <50% stenosis Patent  SFA Occluded native with thrombosed stent, patent common femoral artery to below-the-knee popliteal bypass: 50% stenosis (30-40 mm Hg gradient), <30% residual stenosis   PFA Patent, <50% stenosis   Pop Patent   Trif Patent   AT Patent, distal dwindles   Pero Patent   PT Patent    SPECIMEN(S):  none  INDICATIONS:   Traci Raymond is a 67 y.o. 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  The patient presents for: aortogram, pelvic angiogram and possible intervention.  I discussed with the patient the nature of  angiographic procedures, especially the limited patencies of any endovascular intervention.  The patient is aware of that the risks of an angiographic procedure include but are not limited to: bleeding, infection, access site complications, renal failure, embolization, rupture of vessel, dissection, possible need for emergent surgical intervention, possible need for surgical procedures to treat the patient's pathology, and stroke and death.  The patient is aware of the risks and agrees to proceed.  DESCRIPTION: After full informed consent was obtained from the patient, the patient was brought back to the angiography suite.  The patient was placed supine upon the angiography table and connected to cardiopulmonary monitoring equipment.  The patient was then given conscious sedation, the amounts of which are documented in the patient's chart.  A circulating radiologic technician maintained continuous monitoring of the patient's cardiopulmonary status.  Additionally, the control room radiologic technician provided backup monitoring throughout the procedure.  The patient was prepped and drape in the standard fashion for an angiographic procedure.  At this point, attention was turned to the right groin.  I could not image a pulsatile common femoral artery, so I turned my attention to the left groin.  Under ultrasound guidance, the subcutaneous tissue surrounding the left common femoral artery was anesthesized with 1% lidocaine with epinephrine.  Under Sonosite guidance, the patency of the artery was noted to be: calcified with patent lumen.  Ultrasound image was permanently recorded.  The artery was then cannulated with a micropuncture needle.  The microwire was advanced into the iliac arterial system.  The needle was exchanged for a microsheath, which was loaded into the common  femoral artery over the wire.  The microwire was exchanged for a Bentson wire which was advanced into the aorta.  The microsheath was then  exchanged for a 5-Fr sheath which was loaded into the common femoral artery.  The Omniflush catheter was then loaded over the wire up to the level of L1.  The catheter was connected to the power injector circuit.  After de-airring and de-clotting the circuit, a power injector aortogram was completed.  Based on the images, I felt intervention on the in-stent restenosis was necessary.  The wire was exchanged for a Rosen wire.  The patient's left femoral sheath was exchanged for a 7-Fr Destination sheath, which was lodged in the right common iliac artery.  The dilator was removed.  The patient was given 9500 units of Heparin intravenously, which was a therapeutic bolus.    I pulled the catheter down to the distal aorta and did a RAO pelvic injection to image the right external iliac artery and associated stents.  Based on the images, I felt drug coated angioplasty was indicated.  Based on the prior stent sizes, I first selected a 6 mm x 40 mm bland angioplasty balloon and inflated the balloon at 10 atm for 1 minute throughout the two external iliac artery stents.  I then exchanged the balloon for a Lutonix 6 mm x 60 mm drug coated balloon and inflated the balloon at 5 atm at 2 minute throughout the proximal stent, extending 1 cm into the distal stent.  The balloon was deflated and removed.  Completion hand injection demonstrated no residual stenosis and no evidence of embolization.    I loaded a straight catheter over the wire and then removed the wire.  The catheter was connected to the power injector circuit.  An automated right leg runoff was completed.  The findings are listed above.  Based on the images, I had some concern of the proximal anastomosis.  I placed the straight catheter into the proximal bypass graft and gave 200 mcg of nitroglycerin to vasodilate the distal runoff.  After waiting a few minutes, I did a pull back gradient across the proximal stenosis, revealing a 30-40% gradient.  I felt this  merited intervention.  Using a Glidewire, I got into the bypass graft and then advanced the catheter into the graft.  The wire was exchanged for a Rosen wire.  I obtained a 5 mm x 20 mm balloon and centered it on the stenosis.  This was inflated to 6 atm for 1 minute.  I deflated the balloon and exchanged it for a Lutonix 5 mm x 40 mm drug coated balloon.  This was inflated at 6 atm for 2 minutes.  I deflated and removed the balloon.  I did hand injection which demonstrated not embolization and <30% residual stenosis.  I pulled the sheath back into the left external iliac artery.  The sheath was aspirated.  No clots were present and the sheath was reloaded with heparinized saline.     COMPLICATIONS: none  CONDITION: stable   Adele Barthel, MD, [facility] [place] Office: [phone] Pager: [phone]  12/21/2017, 9:05 AM

## 2017-12-21 NOTE — Telephone Encounter (Signed)
sch appt 01/31/18  2pm ABI 3pm  f/u PA  s/p aortogram, RLE runoff, angio plasty R iliac art an CFA

## 2017-12-21 NOTE — Interval H&P Note (Signed)
   History and Physical Update  The patient was interviewed and re-examined.  The patient's previous History and Physical has been reviewed and is unchanged from my consult.  There is no change in the plan of care: aortogram, bilateral pelvic angiogram, possible right iliac intervention.   I discussed with the patient the nature of angiographic procedures, especially the limited patencies of any endovascular intervention.    The patient is aware of that the risks of an angiographic procedure include but are not limited to: bleeding, infection, access site complications, renal failure, embolization, rupture of vessel, dissection, arteriovenous fistula, possible need for emergent surgical intervention, possible need for surgical procedures to treat the patient's pathology, anaphylactic reaction to contrast, and stroke and death.    The patient is aware of the risks and agrees to proceed.   Adele Barthel, MD, FACS [facility] [place] Office: [phone] Pager: [phone]  12/21/2017, 7:11 AM

## 2017-12-22 ENCOUNTER — Encounter (HOSPITAL_COMMUNITY): Payer: Self-pay | Admitting: Vascular Surgery

## 2017-12-22 ENCOUNTER — Other Ambulatory Visit: Payer: Self-pay

## 2017-12-22 DIAGNOSIS — Z48812 Encounter for surgical aftercare following surgery on the circulatory system: Secondary | ICD-10-CM

## 2017-12-22 DIAGNOSIS — I70261 Atherosclerosis of native arteries of extremities with gangrene, right leg: Secondary | ICD-10-CM

## 2017-12-22 DIAGNOSIS — I739 Peripheral vascular disease, unspecified: Secondary | ICD-10-CM

## 2017-12-22 LAB — POCT ACTIVATED CLOTTING TIME: Activated Clotting Time: 219 seconds

## 2017-12-22 MED FILL — Heparin Sod (Porcine)-NaCl IV Soln 1000 Unit/500ML-0.9%: INTRAVENOUS | Qty: 1000 | Status: AC

## 2018-01-31 ENCOUNTER — Ambulatory Visit (INDEPENDENT_AMBULATORY_CARE_PROVIDER_SITE_OTHER): Payer: Medicare Other | Admitting: Physician Assistant

## 2018-01-31 ENCOUNTER — Ambulatory Visit (HOSPITAL_COMMUNITY)
Admit: 2018-01-31 | Discharge: 2018-01-31 | Disposition: A | Discharge status: Home / Self Care | Payer: Medicare Other | Attending: Vascular Surgery | Admitting: Vascular Surgery

## 2018-01-31 VITALS — BP 139/58 | HR 62 | Temp 97.7°F | Resp 20 | Ht 61.0 in | Wt 197.0 lb

## 2018-01-31 DIAGNOSIS — I739 Peripheral vascular disease, unspecified: Secondary | ICD-10-CM

## 2018-01-31 DIAGNOSIS — I70261 Atherosclerosis of native arteries of extremities with gangrene, right leg: Secondary | ICD-10-CM | POA: Diagnosis not present

## 2018-01-31 DIAGNOSIS — F17210 Nicotine dependence, cigarettes, uncomplicated: Secondary | ICD-10-CM | POA: Diagnosis not present

## 2018-01-31 DIAGNOSIS — E1159 Type 2 diabetes mellitus with other circulatory complications: Secondary | ICD-10-CM | POA: Insufficient documentation

## 2018-01-31 DIAGNOSIS — I10 Essential (primary) hypertension: Secondary | ICD-10-CM | POA: Diagnosis not present

## 2018-01-31 DIAGNOSIS — Z48812 Encounter for surgical aftercare following surgery on the circulatory system: Secondary | ICD-10-CM | POA: Diagnosis not present

## 2018-01-31 NOTE — Progress Notes (Signed)
HISTORY AND PHYSICAL     CC:  Follow up for procedure  Requesting Provider:  Ocie Bob, FNP  HPI: This is a 67 y.o. female pt of Dr. Bridgett Larsson who has a hx of right femoral to below knee bypass with propaten and right 2nd toe amputation, right calf exploration and washout 10/17/15 and PTA and stenting of right EIA, PTA of right CFA and proximal femoropopliteal bypass anastomosis and right profunda on 05/25/17 and most recently, bland PTA and drug coated PTA of right EIA on 12/21/17.  She returns today for follow up.  She states she has done well since the procedure.  Her claudication sx have resolved.  She denies any problems with her left leg.  She takes plavix and aspirin daily.  The pt is on a statin for cholesterol management.  She is on oral agents for diabetes.  She is on a CCB and ACEI for blood pressure management.   Past Medical History:  Diagnosis Date  . Cancer ([place])    Uterian  . Diabetes mellitus without complication ([place])   . Hyperlipidemia    Controlled by Rx  . Hypertension   . Peripheral vascular disease [facility])     Past Surgical History:  Procedure Laterality Date  . ABDOMINAL AORTOGRAM W/LOWER EXTREMITY N/A 05/25/2017   Procedure: ABDOMINAL AORTOGRAM W/LOWER EXTREMITY;  Surgeon: Conrad Montrose, MD;  Location: [place] [facility];  Service: Cardiovascular;  Laterality: N/A;  . ABDOMINAL AORTOGRAM W/LOWER EXTREMITY N/A 12/21/2017   Procedure: ABDOMINAL AORTOGRAM W/LOWER EXTREMITY;  Surgeon: Conrad [place], MD;  Location: [place] [facility];  Service: Cardiovascular;  Laterality: N/A;  . ABDOMINAL HYSTERECTOMY    . AMPUTATION TOE Right 09/09/2015   Procedure: AMPUTATION TOE-RIGHT SECOND ;  Surgeon: Conrad [place], MD;  Location: [place];  Service: Vascular;  Laterality: Right;  . FEMORAL-POPLITEAL BYPASS GRAFT Right 09/09/2015   Procedure: BYPASS GRAFT RIGHT COMMON FEMORAL-BELOW KNEE POPLITEAL ARTERY USING GORETEX PROPATEN 6MM X 80CM GRAFT;  Surgeon: Conrad [place], MD;  Location: [place];  Service: Vascular;  Laterality: Right;  . I&D EXTREMITY Right 10/17/2015   Procedure: CALF WASHOUT; WOUND VAC APPLICATION;  Surgeon: Conrad [place], MD;  Location: [place];  Service: Vascular;  Laterality: Right;  . PERIPHERAL VASCULAR CATHETERIZATION N/A 08/27/2015   Procedure: Abdominal Aortogram;  Surgeon: Conrad [place], MD;  Location: [place] [facility];  Service: Cardiovascular;  Laterality: N/A;  . PERIPHERAL VASCULAR INTERVENTION Right 05/25/2017   Procedure: PERIPHERAL VASCULAR INTERVENTION;  Surgeon: Conrad Schurz, MD;  Location: [place] [facility];  Service: Cardiovascular;  Laterality: Right;  . PERIPHERAL VASCULAR INTERVENTION Right 12/21/2017   Procedure: PERIPHERAL VASCULAR INTERVENTION;  Surgeon: Conrad , MD;  Location: [place] [facility];  Service: Cardiovascular;  Laterality: Right;  . Stents Bilateral    LE Right Leg one stent and Left has 2 stents    No Known Allergies  Current Outpatient Medications  Medication Sig Dispense Refill  . amLODipine (NORVASC) 10 MG tablet Take 10 mg by mouth daily.    Marland Kitchen aspirin EC 81 MG tablet Take 81 mg by mouth every morning.     . cholecalciferol (VITAMIN D) 1000 units tablet Take 1,000 Units by mouth every morning.     . clopidogrel (PLAVIX) 75 MG tablet Take 75 mg by mouth every morning.     . ferrous sulfate 325 (65 FE) MG tablet Take 325 mg by mouth daily with breakfast.    .  insulin aspart (NOVOLOG FLEXPEN) 100 UNIT/ML FlexPen Inject 30 Units into the skin daily.     Marland Kitchen LEVEMIR FLEXTOUCH 100 UNIT/ML Pen Inject 30 Units into the skin at bedtime.     Marland Kitchen lisinopril (PRINIVIL,ZESTRIL) 20 MG tablet Take 20 mg by mouth 2 (two) times daily.     . metFORMIN (GLUCOPHAGE) 1000 MG tablet Take 1,000 mg by mouth 2 (two) times daily with a meal.     . potassium chloride (K-DUR,KLOR-CON) 10 MEQ tablet Take 10 mEq by mouth daily.    . rosuvastatin (CRESTOR) 40 MG tablet Take 40 mg by mouth every morning.     . sitaGLIPtin (JANUVIA) 100 MG tablet  Take 100 mg by mouth daily.     No current facility-administered medications for this visit.     Family History  Problem Relation Age of Onset  . Diabetes Mother   . Hypertension Mother   . Hyperlipidemia Mother   . Diabetes Father   . Hyperlipidemia Father   . Hypertension Father     Social History   Socioeconomic History  . Marital status: Widowed    Spouse name: Not on file  . Number of children: Not on file  . Years of education: Not on file  . Highest education level: Not on file  Occupational History  . Not on file  Social Needs  . Financial resource strain: Not on file  . Food insecurity:    Worry: Not on file    Inability: Not on file  . Transportation needs:    Medical: Not on file    Non-medical: Not on file  Tobacco Use  . Smoking status: Current Some Day Smoker    Packs/day: 0.50    Types: Cigarettes  . Smokeless tobacco: Never Used  . Tobacco comment: 1/2 pk per day.   Substance and Sexual Activity  . Alcohol use: No    Alcohol/week: 0.0 oz  . Drug use: No    Comment: pt denies   . Sexual activity: Not on file  Lifestyle  . Physical activity:    Days per week: Not on file    Minutes per session: Not on file  . Stress: Not on file  Relationships  . Social connections:    Talks on phone: Not on file    Gets together: Not on file    Attends religious service: Not on file    Active member of club or organization: Not on file    Attends meetings of clubs or organizations: Not on file    Relationship status: Not on file  . Intimate partner violence:    Fear of current or ex partner: Not on file    Emotionally abused: Not on file    Physically abused: Not on file    Forced sexual activity: Not on file  Other Topics Concern  . Not on file  Social History Narrative  . Not on file     REVIEW OF SYSTEMS:   [X]  denotes positive finding, [ ]  denotes negative finding Cardiac  Comments:  Chest pain or chest pressure:    Shortness of breath upon  exertion:    Short of breath when lying flat:    Irregular heart rhythm:        Vascular    Pain in calf, thigh, or hip brought on by ambulation:    Pain in feet at night that wakes you up from your sleep:     Blood clot in your veins:  Leg swelling:         Pulmonary    Oxygen at home:    Productive cough:     Wheezing:         Neurologic    Sudden weakness in arms or legs:     Sudden numbness in arms or legs:     Sudden onset of difficulty speaking or slurred speech:    Temporary loss of vision in one eye:     Problems with dizziness:         Gastrointestinal    Blood in stool:     Vomited blood:         Genitourinary    Burning when urinating:     Blood in urine:        Psychiatric    Major depression:         Hematologic    Bleeding problems:    Problems with blood clotting too easily:        Skin    Rashes or ulcers:        Constitutional    Fever or chills:      PHYSICAL EXAMINATION:  Vitals:   01/31/18 1445  BP: (!) 139/58  Pulse: 62  Resp: 20  Temp: 97.7 F (36.5 C)  SpO2: 96%   Vitals:   01/31/18 1445  Weight: 197 lb (89.4 kg)  Height: 5\' 1"  (1.549 m)   Body mass index is 37.22 kg/m.  General:  WDWN in NAD; vital signs documented above Gait: Normal HENT: WNL, normocephalic Pulmonary: normal non-labored breathing , without Rales, rhonchi,  wheezing Cardiac: regular HR, without  Murmurs without carotid bruits Abdomen: soft, NT, no masses Skin: without rashes Vascular Exam/Pulses:  Right Left  Radial 2+ (normal) 2+ (normal)  Ulnar 1+ (weak) Unable to palpate  Popliteal Unable to palpate  Unable to palpate   DP biphasic monophasic  PT biphasic monophasic  Peroneal biphasic monophasic   Extremities: without ischemic changes, without Gangrene , without cellulitis; without open wounds; +swelling dorsum of bilateral feet.   Musculoskeletal: no muscle wasting or atrophy  Neurologic: A&O X 3;  No focal weakness or paresthesias are  detected Psychiatric:  The pt has Normal affect.   Non-Invasive Vascular Imaging:   ABI's 01/31/18: Right:  0.93 Left:  0.53  TBI's 01/31/18: Right: 0.98 Left:  0.53  Previous ABI's Right:  0.54 Left:  0.66  Pt meds includes: Statin:  Yes.   Beta Blocker:  No. Aspirin:  Yes.   ACEI:  Yes.   ARB:  No. CCB use:  Yes Other Antiplatelet/Anticoagulant:  Yes Plavix   ASSESSMENT/PLAN:: 67 y.o. female hx of right femoral to below knee bypass with propaten and right 2nd toe amputation, right calf exploration and washout 10/17/15 and PTA and stenting of right EIA, PTA of right CFA and proximal femoropopliteal bypass anastomosis and right profunda on 05/25/17 and most recently, bland PTA and drug coated PTA of right EIA on 12/21/17.     -pt doing well and her right leg claudication sx have resolved.  She continues to take her Plavix and aspirin daily.  The pt is on a statin for cholesterol management.  -Her ABI's improved to 0.93 from 0.54 on the right.  Her ABI on the left reveals moderate arterial disease, but she is asymptomatic from this.   -she will return in 6 months with repeat ABI's and duplex of her RLE bypass graft. -she will call sooner should she develop any issues  Leontine Locket, PA-C [facility] [phone]  Clinic MD:  Bridgett Larsson

## 2018-02-14 ENCOUNTER — Other Ambulatory Visit: Payer: Self-pay

## 2018-02-14 DIAGNOSIS — I739 Peripheral vascular disease, unspecified: Secondary | ICD-10-CM

## 2018-02-14 DIAGNOSIS — I70269 Atherosclerosis of native arteries of extremities with gangrene, unspecified extremity: Secondary | ICD-10-CM

## 2018-08-16 ENCOUNTER — Ambulatory Visit (INDEPENDENT_AMBULATORY_CARE_PROVIDER_SITE_OTHER)
Admission: RE | Admit: 2018-08-16 | Discharge: 2018-08-16 | Disposition: A | Discharge status: Home / Self Care | Payer: Medicare Other | Source: Ambulatory Visit | Attending: Family | Admitting: Family

## 2018-08-16 ENCOUNTER — Other Ambulatory Visit: Payer: Self-pay | Admitting: *Deleted

## 2018-08-16 ENCOUNTER — Other Ambulatory Visit: Payer: Self-pay

## 2018-08-16 ENCOUNTER — Ambulatory Visit (HOSPITAL_COMMUNITY)
Admission: RE | Admit: 2018-08-16 | Discharge: 2018-08-16 | Disposition: A | Discharge status: Home / Self Care | Payer: Medicare Other | Source: Ambulatory Visit | Attending: Family | Admitting: Family

## 2018-08-16 ENCOUNTER — Ambulatory Visit (INDEPENDENT_AMBULATORY_CARE_PROVIDER_SITE_OTHER): Payer: Medicare Other | Admitting: Family

## 2018-08-16 ENCOUNTER — Encounter: Payer: Self-pay | Admitting: *Deleted

## 2018-08-16 ENCOUNTER — Encounter: Payer: Self-pay | Admitting: Family

## 2018-08-16 VITALS — BP 176/74 | HR 64 | Temp 97.2°F | Ht 61.0 in | Wt 193.0 lb

## 2018-08-16 DIAGNOSIS — I70269 Atherosclerosis of native arteries of extremities with gangrene, unspecified extremity: Secondary | ICD-10-CM | POA: Insufficient documentation

## 2018-08-16 DIAGNOSIS — F17200 Nicotine dependence, unspecified, uncomplicated: Secondary | ICD-10-CM

## 2018-08-16 DIAGNOSIS — I779 Disorder of arteries and arterioles, unspecified: Secondary | ICD-10-CM

## 2018-08-16 DIAGNOSIS — I739 Peripheral vascular disease, unspecified: Secondary | ICD-10-CM

## 2018-08-16 NOTE — Progress Notes (Signed)
[facility] [place]   CC: Follow up peripheral artery occlusive disease  History of Present Illness [facility] is a 68 y.o. female who is s/p right femoral to below knee bypass with propaten and right 2nd toe amputation, right calf exploration and washout 10/17/15 and PTA and stenting of right EIA, PTA of right CFA and proximal femoropopliteal bypass anastomosis and right profunda on 05/25/17; drug coated PTA of right EIA on 12/21/17 by Dr. Bridgett Larsson.   Pt states that she had a stent placed in what sounds like her left SFA years ago in [place].   Pt was last evaluated on 01-31-18 by S. Rhyne, PA-C. At that time she was doing well, right leg claudication sx have resolved.  She continued to take her Plavix, aspirin, and statin daily.   Her ABI's improved to 0.93 from 0.54 on the right.  Her ABI on the left revealed moderate arterial disease, but she was asymptomatic from this.   Pt was to return in 6 months with repeat ABI's and duplex of her RLE bypass graft.  She states that she walks a total of about 20 minutes daily. She does not seem to have pain with this amount of walking, but states her legs feels tired if she walks any more.   She state she may have had a stroke many years ago, does not recall her symptoms.  She denies any known hx of MI.   At today's visit she denies dyspnea or chest pain, denies abdominal pain.   Diabetic: Yes, pt states her last A1C was 8.0 Tobacco use: smoker  (1/2 ppd, started at about age 62, quit for 3 years, then reumed)  Pt meds include: Statin :Yes Betablocker: No ASA: Yes Other anticoagulants/antiplatelets: Plavix  Past Medical History:  Diagnosis Date  . Cancer ([place])    Uterian  . Diabetes mellitus without complication ([place] [facility])   . Hyperlipidemia    Controlled by Rx  . Hypertension   . Peripheral vascular disease [place])     Social History Social History   Tobacco Use  . Smoking status: Current Some Day Smoker     Packs/day: 0.50    Types: Cigarettes  . Smokeless tobacco: Never Used  . Tobacco comment: 1/2 pk per day.   Substance Use Topics  . Alcohol use: No    Alcohol/week: 0.0 standard drinks  . Drug use: No    Comment: pt denies     Family History Family History  Problem Relation Age of Onset  . Diabetes Mother   . Hypertension Mother   . Hyperlipidemia Mother   . Diabetes Father   . Hyperlipidemia Father   . Hypertension Father     Past Surgical History:  Procedure Laterality Date  . ABDOMINAL AORTOGRAM W/LOWER EXTREMITY N/A 05/25/2017   Procedure: ABDOMINAL AORTOGRAM W/LOWER EXTREMITY;  Surgeon: Conrad Lafitte, MD;  Location: [facility];  Service: Cardiovascular;  Laterality: N/A;  . ABDOMINAL AORTOGRAM W/LOWER EXTREMITY N/A 12/21/2017   Procedure: ABDOMINAL AORTOGRAM W/LOWER EXTREMITY;  Surgeon: Conrad Glenmont, MD;  Location: [facility];  Service: Cardiovascular;  Laterality: N/A;  . ABDOMINAL HYSTERECTOMY    . AMPUTATION TOE Right 09/09/2015   Procedure: AMPUTATION TOE-RIGHT SECOND ;  Surgeon: Conrad Twain Harte, MD;  Location: [facility];  Service: Vascular;  Laterality: Right;  . FEMORAL-POPLITEAL BYPASS GRAFT Right 09/09/2015   Procedure: BYPASS GRAFT RIGHT COMMON FEMORAL-BELOW KNEE POPLITEAL ARTERY USING GORETEX PROPATEN 6MM X 80CM GRAFT;  Surgeon:  Conrad Gleneagle, MD;  Location: [facility];  Service: Vascular;  Laterality: Right;  . I&D EXTREMITY Right 10/17/2015   Procedure: CALF WASHOUT; WOUND VAC APPLICATION;  Surgeon: Conrad Richville, MD;  Location: [place];  Service: Vascular;  Laterality: Right;  . PERIPHERAL VASCULAR CATHETERIZATION N/A 08/27/2015   Procedure: Abdominal Aortogram;  Surgeon: Conrad Fort Lawn, MD;  Location: [facility];  Service: Cardiovascular;  Laterality: N/A;  . PERIPHERAL VASCULAR INTERVENTION Right 05/25/2017   Procedure: PERIPHERAL VASCULAR INTERVENTION;  Surgeon: Conrad Rowena, MD;  Location: [facility];  Service: Cardiovascular;  Laterality: Right;   . PERIPHERAL VASCULAR INTERVENTION Right 12/21/2017   Procedure: PERIPHERAL VASCULAR INTERVENTION;  Surgeon: Conrad , MD;  Location: [facility];  Service: Cardiovascular;  Laterality: Right;  . Stents Bilateral    LE Right Leg one stent and Left has 2 stents    No Known Allergies  Current Outpatient Medications  Medication Sig Dispense Refill  . amLODipine (NORVASC) 10 MG tablet Take 10 mg by mouth daily.    [facility] aspirin EC 81 MG tablet Take 81 mg by mouth every morning.     . cephALEXin (KEFLEX) 500 MG capsule   0  . cholecalciferol (VITAMIN D) 1000 units tablet Take 1,000 Units by mouth every morning.     . clopidogrel (PLAVIX) 75 MG tablet Take 75 mg by mouth every morning.     . ferrous sulfate 325 (65 FE) MG tablet Take 325 mg by mouth daily with breakfast.    . insulin aspart (NOVOLOG FLEXPEN) 100 UNIT/ML FlexPen Inject 30 Units into the skin daily.     [facility] LEVEMIR FLEXTOUCH 100 UNIT/ML Pen Inject 30 Units into the skin at bedtime.     [facility] lisinopril (PRINIVIL,ZESTRIL) 20 MG tablet Take 20 mg by mouth 2 (two) times daily.     . metFORMIN (GLUCOPHAGE) 1000 MG tablet Take 1,000 mg by mouth 2 (two) times daily with a meal.     . potassium chloride (K-DUR) 10 MEQ tablet   11  . potassium chloride (K-DUR,KLOR-CON) 10 MEQ tablet Take 10 mEq by mouth daily.    . rosuvastatin (CRESTOR) 40 MG tablet Take 40 mg by mouth every morning.     . sitaGLIPtin (JANUVIA) 100 MG tablet Take 100 mg by mouth daily.     No current facility-administered medications for this visit.     ROS: See HPI for pertinent positives and negatives.   Physical Examination  Vitals:   08/16/18 1039 08/16/18 1041  BP: (!) 152/69 (!) 176/74  Pulse: 64   Temp: (!) 97.2 F (36.2 C)   TempSrc: Oral   SpO2: 96%   Weight: 193 lb (87.5 kg)   Height: 5\' 1"  (1.549 m)    Body mass index is 36.47 kg/m.  General: A&O x 3, WDWN, obese female. Gait: normal HENT: No gross abnormalities.  Eyes:  PERRLA. Pulmonary: Respirations are non labored, fair air movement in all fields, no rales, rhonchi, or wheezes. Occasional loose cough.  Cardiac: regular rhythm, + low to moderate grade murmur.         Carotid Bruits Right Left   Positive Positive   Radial pulses are 2+ palpable bilaterally   Adominal aortic pulse is not palpable                         VASCULAR EXAM: Extremities without ischemic changes, without Gangrene; without open wounds.  LE Pulses Right Left       FEMORAL  2+ palpable  2+ palpable        POPLITEAL  not palpable   not palpable       POSTERIOR TIBIAL  1+ palpable   not palpable        DORSALIS PEDIS      ANTERIOR TIBIAL 2+ palpable  not palpable    Abdomen: soft, NT, no palpable masses. Skin: no rashes, no cellulitis, no ulcers noted. Musculoskeletal: no muscle wasting or atrophy.  Neurologic: A&O X 3; appropriate affect, Sensation is normal; MOTOR FUNCTION:  moving all extremities equally, motor strength 5/5 throughout. Speech is fluent/normal. CN 2-12 intact. Psychiatric: Thought content is normal, mood appropriate for clinical situation.     ASSESSMENT: [facility] is a 68 y.o. female who is s/p right femoral to below knee bypass with propaten and right 2nd toe amputation, right calf exploration and washout 10/17/15 and PTA and stenting of right EIA, PTA of right CFA and proximal femoropopliteal bypass anastomosis and right profunda on 05/25/17; drug coated PTA of right EIA on 12/21/17 by Dr. Bridgett Larsson.   Both legs feel tired after walking about 20 minutes, she has no rest pain, no signs of ischemia in her lower extremities, no open wounds.   Her atherosclerotic risk factors include uncontrolled DM and active smoking since age 45.  I advised her to works closely with her PCP to get her DM in a good control as possible to reduce her chances of  developing DM complications; e.g, worsening PAD, CAD, CKD, DM neuropathy, DM retinopathy, et al.  She takes a daily ASA, Plavix, and a statin.     312 cm/s velocity in the right distal external iliac artery, and 333 cm/s velocity in the right mid CFA; biphasic waveforms in the right LE.  No significant stenosis in the right fem-pop bypass graft.  Limited view of the left SFA showed an occluded stent.   Active smoker since age 4 - Over 3 minutes was spent counseling patient re smoking cessation, and patient was given several free resources re smoking cessation.   DATA  Right Duplex Findings: +----------+--------+-----+--------+--------+--------+           PSV cm/sRatioStenosisWaveformComments +----------+--------+-----+--------+--------+--------+ EIA Mid   188                  biphasicstent    +----------+--------+-----+--------+--------+--------+ EIA [id number]                  biphasicstent    +----------+--------+-----+--------+--------+--------+ CFA Mid   333                  biphasic         +----------+--------+-----+--------+--------+--------+ ATA Distal104                  biphasic         +----------+--------+-----+--------+--------+--------+ PTA Distal141                  biphasic         +----------+--------+-----+--------+--------+--------+    Right Stent(s): +---+--------+--------+--------+--------+ EIAPSV cm/sStenosisWaveformComments +---+--------+--------+--------+--------+   Right Graft #1: fem-pop bypass graft +------------------+--------+--------+----------+--------+                   PSV cm/sStenosisWaveform  Comments +------------------+--------+--------+----------+--------+ Inflow            198             biphasic           +------------------+--------+--------+----------+--------+  Prox Anastomosis  267             biphasic            +------------------+--------+--------+----------+--------+ Proximal Graft    109             biphasic           +------------------+--------+--------+----------+--------+ Mid Graft         87              biphasic           +------------------+--------+--------+----------+--------+ Distal Graft      85              monophasic         +------------------+--------+--------+----------+--------+ Distal Anastomosis149             biphasic           +------------------+--------+--------+----------+--------+ Outflow           144             biphasic           +------------------+--------+--------+----------+--------+    Left Duplex Findings: +----------+--------+-----+--------+--------+--------+           PSV cm/sRatioStenosisWaveformComments +----------+--------+-----+--------+--------+--------+ CFA Distal183                  biphasic         +----------+--------+-----+--------+--------+--------+ SFA Prox  0                    absent           +----------+--------+-----+--------+--------+--------+    Left Stent(s): +---+--------+--------+--------+--------+ SFAPSV cm/sStenosisWaveformComments +---+--------+--------+--------+--------+ Limited view of left SFA showed an occluded stent       ABI (Date: 08/16/2018):  R:   ABI: 0.96 (was 0.93 on 01-31-18),   PT: tri  DP: bi  TBI:  0.80, toe pressure 146, (was 0.93)  L:   ABI: 0.62 (was 0.53),   PT: mono  DP: mono  TBI: 0.43, toe pressure 78, (was 0.53)  Right ABI remains normal with tri and biphasic waveforms.  Slight improvement in left ABI, moderate disease with monophasic waveforms.     PLAN:  Based on the patient's vascular studies and examination, and after discussing with Dr. Oneida Alar the greater than 300 velocities in the right EIA and right CFA, pt will be scheduled for aortogram with run off, possible right LE intervention.  Pt declined to have this  done any sooner than the 3rd week in February. I discussed with her that elective and sooner evaluation and intervention has the possibility of better outcome, than on an emergency basis if she suddenly develops a worse stenosis or occlusion of the arteries in question.    I discussed in depth with the patient the nature of atherosclerosis, and emphasized the importance of maximal medical management including strict control of blood pressure, blood glucose, and lipid levels, obtaining regular exercise, and cessation of smoking.  The patient is aware that without maximal medical management the underlying atherosclerotic disease process will progress, limiting the benefit of any interventions.  The patient was given information about PAD including signs, symptoms, treatment, what symptoms should prompt the patient to seek immediate medical care, and risk reduction measures to take.  Clemon Chambers, RN, MSN, FNP-C [facility] Phone: [phone]  Clinic MD: [facility]  08/16/18 10:49 AM

## 2018-08-16 NOTE — Patient Instructions (Signed)
Steps to Quit Smoking  Smoking tobacco can be bad for your health. It can also affect almost every organ in your body. Smoking puts you and people around you at risk for many serious long-lasting (chronic) diseases. Quitting smoking is hard, but it is one of the best things that you can do for your health. It is never too late to quit. What are the benefits of quitting smoking? When you quit smoking, you lower your risk for getting serious diseases and conditions. They can include:  Lung cancer or lung disease.  Heart disease.  Stroke.  Heart attack.  Not being able to have children (infertility).  Weak bones (osteoporosis) and broken bones (fractures). If you have coughing, wheezing, and shortness of breath, those symptoms may get better when you quit. You may also get sick less often. If you are pregnant, quitting smoking can help to lower your chances of having a baby of low birth weight. What can I do to help me quit smoking? Talk with your doctor about what can help you quit smoking. Some things you can do (strategies) include:  Quitting smoking totally, instead of slowly cutting back how much you smoke over a period of time.  Going to in-person counseling. You are more likely to quit if you go to many counseling sessions.  Using resources and support systems, such as: ? Online chats with a counselor. ? Phone quitlines. ? Printed self-help materials. ? Support groups or group counseling. ? Text messaging programs. ? Mobile phone apps or applications.  Taking medicines. Some of these medicines may have nicotine in them. If you are pregnant or breastfeeding, do not take any medicines to quit smoking unless your doctor says it is okay. Talk with your doctor about counseling or other things that can help you. Talk with your doctor about using more than one strategy at the same time, such as taking medicines while you are also going to in-person counseling. This can help make  quitting easier. What things can I do to make it easier to quit? Quitting smoking might feel very hard at first, but there is a lot that you can do to make it easier. Take these steps:  Talk to your family and friends. Ask them to support and encourage you.  Call phone quitlines, reach out to support groups, or work with a counselor.  Ask people who smoke to not smoke around you.  Avoid places that make you want (trigger) to smoke, such as: ? Bars. ? Parties. ? Smoke-break areas at work.  Spend time with people who do not smoke.  Lower the stress in your life. Stress can make you want to smoke. Try these things to help your stress: ? Getting regular exercise. ? Deep-breathing exercises. ? Yoga. ? Meditating. ? Doing a body scan. To do this, close your eyes, focus on one area of your body at a time from head to toe, and notice which parts of your body are tense. Try to relax the muscles in those areas.  Download or buy apps on your mobile phone or tablet that can help you stick to your quit plan. There are many free apps, such as QuitGuide from the CDC (Centers for Disease Control and Prevention). You can find more support from smokefree.gov and other websites. This information is not intended to replace advice given to you by your health care provider. Make sure you discuss any questions you have with your health care provider. Document Released: 05/21/2009 Document Revised: 03/22/2016   Document Reviewed: 12/09/2014 Elsevier Interactive Patient Education  2019 Elsevier Inc.     Peripheral Vascular Disease  Peripheral vascular disease (PVD) is a disease of the blood vessels that are not part of your heart and brain. A simple term for PVD is poor circulation. In most cases, PVD narrows the blood vessels that carry blood from your heart to the rest of your body. This can reduce the supply of blood to your arms, legs, and internal organs, like your stomach or kidneys. However, PVD most  often affects a person's lower legs and feet. Without treatment, PVD tends to get worse. PVD can also lead to acute ischemic limb. This is when an arm or leg suddenly cannot get enough blood. This is a medical emergency. Follow these instructions at home: Lifestyle  Do not use any products that contain nicotine or tobacco, such as cigarettes and e-cigarettes. If you need help quitting, ask your doctor.  Lose weight if you are overweight. Or, stay at a healthy weight as told by your doctor.  Eat a diet that is low in fat and cholesterol. If you need help, ask your doctor.  Exercise regularly. Ask your doctor for activities that are right for you. General instructions  Take over-the-counter and prescription medicines only as told by your doctor.  Take good care of your feet: ? Wear comfortable shoes that fit well. ? Check your feet often for any cuts or sores.  Keep all follow-up visits as told by your doctor This is important. Contact a doctor if:  You have cramps in your legs when you walk.  You have leg pain when you are at rest.  You have coldness in a leg or foot.  Your skin changes.  You are unable to get or have an erection (erectile dysfunction).  You have cuts or sores on your feet that do not heal. Get help right away if:  Your arm or leg turns cold, numb, and blue.  Your arms or legs become red, warm, swollen, painful, or numb.  You have chest pain.  You have trouble breathing.  You suddenly have weakness in your face, arm, or leg.  You become very confused or you cannot speak.  You suddenly have a very bad headache.  You suddenly cannot see. Summary  Peripheral vascular disease (PVD) is a disease of the blood vessels.  A simple term for PVD is poor circulation. Without treatment, PVD tends to get worse.  Treatment may include exercise, low fat and low cholesterol diet, and quitting smoking. This information is not intended to replace advice given to  you by your health care provider. Make sure you discuss any questions you have with your health care provider. Document Released: 10/19/2009 Document Revised: 09/01/2016 Document Reviewed: 09/01/2016 Elsevier Interactive Patient Education  2019 Elsevier Inc.  

## 2018-08-16 NOTE — Progress Notes (Signed)
Procedure scheduled date of patient's choosing. Explained in detail pre-procedure instructions and insulin adjustment guidelines. Copy of all given to patient.

## 2018-08-18 ENCOUNTER — Encounter: Payer: Self-pay | Admitting: Family

## 2018-09-14 ENCOUNTER — Ambulatory Visit (HOSPITAL_COMMUNITY): Admission: RE | Admit: 2018-09-14 | Payer: Medicare Other | Source: Home / Self Care | Admitting: Vascular Surgery

## 2018-09-14 ENCOUNTER — Other Ambulatory Visit: Payer: Self-pay | Admitting: *Deleted

## 2018-09-14 ENCOUNTER — Encounter (HOSPITAL_COMMUNITY): Admission: RE | Payer: Self-pay | Source: Home / Self Care

## 2018-09-14 SURGERY — ABDOMINAL AORTOGRAM W/LOWER EXTREMITY
Anesthesia: LOCAL

## 2018-09-14 NOTE — Progress Notes (Signed)
Call to patient to reschedule procedure. Instructed to be at [facility] admitting at 8:30 am on 09/21/2018. Hold pm dose Metformin and 1/2 dose pm insulin on 09/20/2018. Have a bedtime snack then NPO past MN. AM of 09/21/2018 take lisinopril and Amlodipine with sips of water and hold all other medications until after this procedure. Must have a driver and caregiver for discharge to home. Verbalized understanding and read back to nurse. To call if questions.

## 2018-09-21 ENCOUNTER — Ambulatory Visit (HOSPITAL_COMMUNITY)
Admission: RE | Admit: 2018-09-21 | Discharge: 2018-09-21 | Disposition: A | Discharge status: Home / Self Care | Payer: Medicare Other | Attending: Vascular Surgery | Admitting: Vascular Surgery

## 2018-09-21 ENCOUNTER — Encounter (HOSPITAL_COMMUNITY)
Admission: RE | Disposition: A | Discharge status: Home / Self Care | Payer: Self-pay | Source: Home / Self Care | Attending: Vascular Surgery

## 2018-09-21 DIAGNOSIS — Z794 Long term (current) use of insulin: Secondary | ICD-10-CM | POA: Insufficient documentation

## 2018-09-21 DIAGNOSIS — T82858A Stenosis of vascular prosthetic devices, implants and grafts, initial encounter: Secondary | ICD-10-CM

## 2018-09-21 DIAGNOSIS — Z7902 Long term (current) use of antithrombotics/antiplatelets: Secondary | ICD-10-CM | POA: Diagnosis not present

## 2018-09-21 DIAGNOSIS — Z7982 Long term (current) use of aspirin: Secondary | ICD-10-CM | POA: Insufficient documentation

## 2018-09-21 DIAGNOSIS — Z79899 Other long term (current) drug therapy: Secondary | ICD-10-CM | POA: Insufficient documentation

## 2018-09-21 DIAGNOSIS — F17210 Nicotine dependence, cigarettes, uncomplicated: Secondary | ICD-10-CM | POA: Insufficient documentation

## 2018-09-21 DIAGNOSIS — E1151 Type 2 diabetes mellitus with diabetic peripheral angiopathy without gangrene: Secondary | ICD-10-CM | POA: Diagnosis present

## 2018-09-21 DIAGNOSIS — E785 Hyperlipidemia, unspecified: Secondary | ICD-10-CM | POA: Insufficient documentation

## 2018-09-21 DIAGNOSIS — Z8249 Family history of ischemic heart disease and other diseases of the circulatory system: Secondary | ICD-10-CM | POA: Diagnosis not present

## 2018-09-21 DIAGNOSIS — I10 Essential (primary) hypertension: Secondary | ICD-10-CM | POA: Diagnosis not present

## 2018-09-21 HISTORY — PX: ABDOMINAL AORTOGRAM W/LOWER EXTREMITY: CATH118223

## 2018-09-21 LAB — GLUCOSE, CAPILLARY
Glucose-Capillary: 183 mg/dL — ABNORMAL HIGH (ref 70–99)
Glucose-Capillary: 152 mg/dL — ABNORMAL HIGH (ref 70–99)

## 2018-09-21 LAB — POCT I-STAT 4, (NA,K, GLUC, HGB,HCT)
GLUCOSE: 187 mg/dL — AB (ref 70–99)
HCT: 34 % — ABNORMAL LOW (ref 36.0–46.0)
Hemoglobin: 11.6 g/dL — ABNORMAL LOW (ref 12.0–15.0)
Potassium: 4 mmol/L (ref 3.5–5.1)
Sodium: 137 mmol/L (ref 135–145)

## 2018-09-21 LAB — POCT I-STAT CREATININE: CREATININE: 1.1 mg/dL — AB (ref 0.44–1.00)

## 2018-09-21 SURGERY — ABDOMINAL AORTOGRAM W/LOWER EXTREMITY
Anesthesia: LOCAL | Laterality: Bilateral

## 2018-09-21 MED ORDER — SODIUM CHLORIDE 0.9% FLUSH: 3.0000 mL | INTRAVENOUS | Status: DC | PRN

## 2018-09-21 MED ORDER — HEPARIN (PORCINE) IN NACL 1000-0.9 UT/500ML-% IV SOLN
INTRAVENOUS | Status: DC | PRN
  Administered 2018-09-21 (×2): 500 mL

## 2018-09-21 MED ORDER — IODIXANOL 320 MG/ML IV SOLN
INTRAVENOUS | Status: DC | PRN
  Administered 2018-09-21: 130 mL via INTRA_ARTERIAL

## 2018-09-21 MED ORDER — OXYCODONE HCL 5 MG PO TABS: 5.0000 mg | ORAL_TABLET | ORAL | Status: DC | PRN

## 2018-09-21 MED ORDER — SODIUM CHLORIDE 0.9 % IV SOLN: 250.0000 mL | INTRAVENOUS | Status: DC | PRN

## 2018-09-21 MED ORDER — MORPHINE SULFATE (PF) 10 MG/ML IV SOLN: 2.0000 mg | INTRAVENOUS | Status: DC | PRN

## 2018-09-21 MED ORDER — ONDANSETRON HCL 4 MG/2ML IJ SOLN: 4.0000 mg | Freq: Four times a day (QID) | INTRAMUSCULAR | Status: DC | PRN

## 2018-09-21 MED ORDER — LIDOCAINE HCL (PF) 1 % IJ SOLN
INTRAMUSCULAR | Status: DC | PRN
  Administered 2018-09-21: 15 mL via INTRADERMAL

## 2018-09-21 MED ORDER — ACETAMINOPHEN 325 MG PO TABS: 650.0000 mg | ORAL_TABLET | ORAL | Status: DC | PRN

## 2018-09-21 MED ORDER — HYDRALAZINE HCL 20 MG/ML IJ SOLN: 5.0000 mg | INTRAMUSCULAR | Status: DC | PRN

## 2018-09-21 MED ORDER — LIDOCAINE HCL (PF) 1 % IJ SOLN
INTRAMUSCULAR | Status: AC
  Filled 2018-09-21: qty 30

## 2018-09-21 MED ORDER — HEPARIN (PORCINE) IN NACL 1000-0.9 UT/500ML-% IV SOLN
INTRAVENOUS | Status: AC
  Filled 2018-09-21: qty 1000

## 2018-09-21 MED ORDER — SODIUM CHLORIDE 0.9 % IV SOLN
INTRAVENOUS | Status: DC
  Administered 2018-09-21: 08:00:00 via INTRAVENOUS

## 2018-09-21 MED ORDER — SODIUM CHLORIDE 0.9 % IV SOLN: INTRAVENOUS | Status: DC

## 2018-09-21 MED ORDER — SODIUM CHLORIDE 0.9% FLUSH: 3.0000 mL | Freq: Two times a day (BID) | INTRAVENOUS | Status: DC

## 2018-09-21 MED ORDER — LABETALOL HCL 5 MG/ML IV SOLN: 10.0000 mg | INTRAVENOUS | Status: DC | PRN

## 2018-09-21 SURGICAL SUPPLY — 8 items
CATH ANGIO 5F PIGTAIL 65CM (CATHETERS) ×2 IMPLANT
KIT PV (KITS) ×2 IMPLANT
SHEATH PINNACLE 5F 10CM (SHEATH) ×2 IMPLANT
SHEATH PROBE COVER 6X72 (BAG) ×2 IMPLANT
SYR MEDRAD MARK V 150ML (SYRINGE) ×2 IMPLANT
TRANSDUCER W/STOPCOCK (MISCELLANEOUS) ×2 IMPLANT
TRAY PV CATH (CUSTOM PROCEDURE TRAY) ×2 IMPLANT
WIRE HITORQ VERSACORE ST 145CM (WIRE) ×2 IMPLANT

## 2018-09-21 NOTE — Discharge Instructions (Signed)
HOLD METFORMIN FOR A FULL 48 HOURS AFTER DISCHARGE. MAY RESUME MON AM.  DRINK PLENTY OF FLUIDS FOR THE NEXT 2-3 DAYS TO KEEP HYDRATED.  Femoral Site Care This sheet gives you information about how to care for yourself after your procedure. Your health care provider may also give you more specific instructions. If you have problems or questions, contact your health care provider. What can I expect after the procedure? After the procedure, it is common to have:  Bruising that usually fades within 1-2 weeks.  Tenderness at the site. Follow these instructions at home: Wound care  Follow instructions from your health care provider about how to take care of your insertion site. Make sure you: ? Wash your hands with soap and water before you change your bandage (dressing). If soap and water are not available, use hand sanitizer. ? Remove your dressing in 24-48 hours.         Do not take baths, swim, or use a hot tub until your health care provider approves.  You may shower 24-48 hours after the procedure or as told by your health care provider. ? Gently wash the site with plain soap and water. ? Pat the area dry with a clean towel. ? Do not rub the site. This may cause bleeding.  Do not apply powder or lotion to the site. Keep the site clean and dry.  Check your femoral site every day for signs of infection. Check for: ? Redness, swelling, or pain. ? Fluid or blood. ? Warmth. ? Pus or a bad smell. Activity  For the first 2-3 days after your procedure, or as long as directed: ? Avoid climbing stairs as much as possible. ? Do not squat.  Do not lift anything that is heavier than 10 lb (4.5 kg), or the limit that you are told, until your health care provider says that it is safe. For 5 days  Rest as directed. ? Avoid sitting for a long time without moving. Get up to take short walks every 1-2 hours.  Do not drive for 24 hours if you were given a medicine to help you relax  (sedative). General instructions  Take over-the-counter and prescription medicines only as told by your health care provider.  Keep all follow-up visits as told by your health care provider. This is important. Contact a health care provider if you have:  A fever or chills.  You have redness, swelling, or pain around your insertion site. Get help right away if:  The catheter insertion area swells very fast.  You pass out.  You suddenly start to sweat or your skin gets clammy.  The catheter insertion area is bleeding, and the bleeding does not stop when you hold steady pressure on the area.  The area near or just beyond the catheter insertion site becomes pale, cool, tingly, or numb. These symptoms may represent a serious problem that is an emergency. Do not wait to see if the symptoms will go away. Get medical help right away. Call your local emergency services (911 in the [place] Do not drive yourself to the hospital. Summary  After the procedure, it is common to have bruising that usually fades within 1-2 weeks.  Check your femoral site every day for signs of infection.  Do not lift anything that is heavier than 10 lb (4.5 kg), or the limit that you are told, until your health care provider says that it is safe. This information is not intended to replace advice  given to you by your health care provider. Make sure you discuss any questions you have with your health care provider. Document Released: 03/28/2014 Document Revised: 08/07/2017 Document Reviewed: 08/07/2017 Elsevier Interactive Patient Education  2019 [employer].

## 2018-09-21 NOTE — Op Note (Signed)
Procedure: Abdominal aortogram with bilateral lower extremity runoff  Preoperative diagnosis: Vein graft stenosis  Postoperative diagnosis: Same  Anesthesia: Local  Operative findings: Number 180% stenosis right profunda origin  2.  Widely patent right femoral to above-knee popliteal bypass patent aortoiliac segments with patent bilateral external iliac stents  3.  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  Bilateral three-vessel runoff  Operative details: After obtaining informed consent, the patient was taken the PV lab.  The patient is placed in supine position Angie table.  Both groins were prepped and draped in usual sterile fashion.  Local anesthesia was then treated over the left common femoral artery.  Introducer needle was used to cannulate the left common femoral artery using ultrasound guidance.  Left common femoral artery was patent and a permanent hardcopy image was obtained placed in the patient's chart.  An 035 versa core wire was threaded up the abdominal aorta under fluoroscopic guidance.  A 5 French sheath placed over the guidewire and left common femoral artery.  5 French pigtail catheter was then advanced over the guidewire in the abdominal aorta abdominal aortogram was obtained in AP projection.  Left and right renal arteries are patent.  The infrarenal abdominal aorta is patent.  The left and right common external and internal iliac arteries are patent.  There are stents in the common and external iliac artery on the right side which are patent.  The external iliac artery stent on the left side is patent.  There is a superficial femoral artery stent at the origin of the left superficial femoral artery which is occluded.  At this point an LAO oblique view of the pelvis was obtained to further define the right iliac system since this was the area that supposedly had an external  iliac artery stenosis.  There was no significant stenosis.  An additional steep RAO view was performed of the right femoral bifurcation.  This showed an 80% right profunda origin stenosis but no common femoral artery stenosis and no stenosis at the origin of the bypass graft.  Next bilateral lower extremity runoff views were obtained.  In the left lower extremity, the left common femoral artery is patent.  The left profunda femoris is patent.  The left superficial femoral artery is occluded from its origin all the way to the above-knee popliteal artery.  The above-knee popliteal artery is very diseased.  The below the knee popliteal artery is a better quality vessel.  There is three-vessel runoff to the left foot.  In the right lower extremity, the right common femoral and profunda femoris is patent as mentioned above.  The right superficial femoral artery is occluded.  There is a femoral-popliteal bypass graft extending from the right common femoral to the above-knee popliteal artery which is widely patent.  There is three-vessel runoff to the right foot.  At this point the pigtail catheter was removed over guidewire.  The 5 French sheath was left in place to pulled the holding area.  The patient tolerated procedure well and there were no complications.  The patient was taken to the holding area in stable condition.  Upper management: The patient will be scheduled for follow-up with [place] in 6 months time with repeat ABIs and a duplex exam.  Ruta Hinds, MD [facility] [place]: [phone] Pager: [phone]

## 2018-09-21 NOTE — H&P (Signed)
[facility] [place]   CC: Follow up peripheral artery occlusive disease  History of Present Illness Traci Raymond is a 68 y.o. female who is s/pright femoral to below knee bypass with propaten and right 2nd toe amputation, right calf exploration and washout 10/17/15 and PTA and stenting of right EIA, PTA of right CFA and proximal femoropopliteal bypass anastomosis and right profunda on 05/25/17; drug coated PTA of right EIA on 12/21/17 by Dr. Bridgett Larsson.   Pt states that she had a stent placed in what sounds like her left SFA years ago in [place].   Pt was last evaluated on 01-31-18 by S. Rhyne, PA-C. At that time she was doing well, right leg claudication sx have resolved. She continued to take her Plavix, aspirin, and statin daily.  Her ABI's improved to 0.93 from 0.54 on the right. Her ABI on the left revealed moderate arterial disease, but she was asymptomatic from this.  Pt was to return in 6 months with repeat ABI's and duplex of her RLE bypass graft.  She states that she walks a total of about 20 minutes daily. She does not seem to have pain with this amount of walking, but states her legs feels tired if she walks any more.   She state she may have had a stroke many years ago, does not recall her symptoms.  She denies any known hx of MI.   At today's visit she denies dyspnea or chest pain, denies abdominal pain.   Diabetic: Yes, pt states her last A1C was 8.0 Tobacco use: smoker  (1/2 ppd, started at about age 59, quit for 3 years, then reumed)  Pt meds include: Statin :Yes Betablocker: No ASA: Yes Other anticoagulants/antiplatelets: Plavix      Past Medical History:  Diagnosis Date  . Cancer ([place])    Uterian  . Diabetes mellitus without complication ([place])   . Hyperlipidemia    Controlled by Rx  . Hypertension   . Peripheral vascular disease [facility])     Social History Social History        Tobacco Use  . Smoking status:  Current Some Day Smoker    Packs/day: 0.50    Types: Cigarettes  . Smokeless tobacco: Never Used  . Tobacco comment: 1/2 pk per day.   Substance Use Topics  . Alcohol use: No    Alcohol/week: 0.0 standard drinks  . Drug use: No    Comment: pt denies     Family History      Family History  Problem Relation Age of Onset  . Diabetes Mother   . Hypertension Mother   . Hyperlipidemia Mother   . Diabetes Father   . Hyperlipidemia Father   . Hypertension Father          Past Surgical History:  Procedure Laterality Date  . ABDOMINAL AORTOGRAM W/LOWER EXTREMITY N/A 05/25/2017   Procedure: ABDOMINAL AORTOGRAM W/LOWER EXTREMITY;  Surgeon: Conrad Rupert, MD;  Location: [facility];  Service: Cardiovascular;  Laterality: N/A;  . ABDOMINAL AORTOGRAM W/LOWER EXTREMITY N/A 12/21/2017   Procedure: ABDOMINAL AORTOGRAM W/LOWER EXTREMITY;  Surgeon: Conrad [place], MD;  Location: [facility];  Service: Cardiovascular;  Laterality: N/A;  . ABDOMINAL HYSTERECTOMY    . AMPUTATION TOE Right 09/09/2015   Procedure: AMPUTATION TOE-RIGHT SECOND ;  Surgeon: Conrad Knollwood, MD;  Location: [facility];  Service: Vascular;  Laterality: Right;  . FEMORAL-POPLITEAL BYPASS GRAFT Right 09/09/2015   Procedure: BYPASS GRAFT RIGHT  COMMON FEMORAL-BELOW KNEE POPLITEAL ARTERY USING GORETEX PROPATEN 6MM X 80CM GRAFT;  Surgeon: Conrad Dorchester, MD;  Location: [facility];  Service: Vascular;  Laterality: Right;  . I&D EXTREMITY Right 10/17/2015   Procedure: CALF WASHOUT; WOUND VAC APPLICATION;  Surgeon: Conrad Graysville, MD;  Location: [facility];  Service: Vascular;  Laterality: Right;  . PERIPHERAL VASCULAR CATHETERIZATION N/A 08/27/2015   Procedure: Abdominal Aortogram;  Surgeon: Conrad [place], MD;  Location: [facility];  Service: Cardiovascular;  Laterality: N/A;  . PERIPHERAL VASCULAR INTERVENTION Right 05/25/2017   Procedure: PERIPHERAL VASCULAR INTERVENTION;  Surgeon: Conrad Somerset, MD;  Location:  [facility];  Service: Cardiovascular;  Laterality: Right;  . PERIPHERAL VASCULAR INTERVENTION Right 12/21/2017   Procedure: PERIPHERAL VASCULAR INTERVENTION;  Surgeon: Conrad Atlantic, MD;  Location: [facility];  Service: Cardiovascular;  Laterality: Right;  . Stents Bilateral    LE Right Leg one stent and Left has 2 stents    No Known Allergies        Current Outpatient Medications  Medication Sig Dispense Refill  . amLODipine (NORVASC) 10 MG tablet Take 10 mg by mouth daily.    Marland Kitchen aspirin EC 81 MG tablet Take 81 mg by mouth every morning.     . cephALEXin (KEFLEX) 500 MG capsule   0  . cholecalciferol (VITAMIN D) 1000 units tablet Take 1,000 Units by mouth every morning.     . clopidogrel (PLAVIX) 75 MG tablet Take 75 mg by mouth every morning.     . ferrous sulfate 325 (65 FE) MG tablet Take 325 mg by mouth daily with breakfast.    . insulin aspart (NOVOLOG FLEXPEN) 100 UNIT/ML FlexPen Inject 30 Units into the skin daily.     Marland Kitchen LEVEMIR FLEXTOUCH 100 UNIT/ML Pen Inject 30 Units into the skin at bedtime.     Marland Kitchen lisinopril (PRINIVIL,ZESTRIL) 20 MG tablet Take 20 mg by mouth 2 (two) times daily.     . metFORMIN (GLUCOPHAGE) 1000 MG tablet Take 1,000 mg by mouth 2 (two) times daily with a meal.     . potassium chloride (K-DUR) 10 MEQ tablet   11  . potassium chloride (K-DUR,KLOR-CON) 10 MEQ tablet Take 10 mEq by mouth daily.    . rosuvastatin (CRESTOR) 40 MG tablet Take 40 mg by mouth every morning.     . sitaGLIPtin (JANUVIA) 100 MG tablet Take 100 mg by mouth daily.     No current facility-administered medications for this visit.     ROS: See HPI for pertinent positives and negatives.   Physical Examination  Vitals:   09/21/18 0749  BP: (!) 175/57  Pulse: 71  Resp: 18  Temp: 97.7 F (36.5 C)  TempSrc: Oral  SpO2: 100%  Weight: 88.5 kg  Height: 5\' 1"  (1.549 m)    General: A&O x 3, WDWN, obese female. Gait:  normal HENT: No gross abnormalities.  Eyes: PERRLA. Pulmonary: Respirations are non labored, fair air movement in all fields, no rales, rhonchi, or wheezes. Occasional loose cough.  Cardiac: regular rhythm, + low to moderate grade murmur.         Carotid Bruits Right Left   Positive Positive   Radial pulses are 2+ palpable bilaterally   Adominal aortic pulse is not palpable                         VASCULAR EXAM: Extremities without ischemic changes, without Gangrene; without open  wounds.                                                                                                                                                       LE Pulses Right Left       FEMORAL  2+ palpable  2+ palpable        POPLITEAL  not palpable   not palpable       POSTERIOR TIBIAL  1+ palpable   not palpable        DORSALIS PEDIS      ANTERIOR TIBIAL 2+ palpable  not palpable    Abdomen: soft, NT, no palpable masses. Skin: no rashes, no cellulitis, no ulcers noted. Musculoskeletal: no muscle wasting or atrophy.      Neurologic: A&O X 3; appropriate affect, Sensation is normal; MOTOR FUNCTION:  moving all extremities equally, motor strength 5/5 throughout. Speech is fluent/normal. CN 2-12 intact. Psychiatric: Thought content is normal, mood appropriate for clinical situation.     ASSESSMENT: Traci Raymond is a 68 y.o. female who is s/pright femoral to below knee bypass with propaten and right 2nd toe amputation, right calf exploration and washout 10/17/15 and PTA and stenting of right EIA, PTA of right CFA and proximal femoropopliteal bypass anastomosis and right profunda on 05/25/17; drug coated PTA of right EIA on 12/21/17 by Dr. Bridgett Larsson.   Both legs feel tired after walking about 20 minutes, she has no rest pain, no signs of ischemia in her lower extremities, no open wounds.   Her atherosclerotic risk factors include uncontrolled DM and active smoking since age 45.  I  advised her to works closely with her PCP to get her DM in a good control as possible to reduce her chances of developing DM complications; e.g, worsening PAD, CAD, CKD, DM neuropathy, DM retinopathy, et al.  She takes a daily ASA, Plavix, and a statin.     312 cm/s velocity in the right distal external iliac artery, and 333 cm/s velocity in the right mid CFA; biphasic waveforms in the right LE.  No significant stenosis in the right fem-pop bypass graft.  Limited view of the left SFA showed an occluded stent.   Active smoker since age 25 - Over 3 minutes was spent counseling patient re smoking cessation, and patient was given several free resources re smoking cessation.   DATA  Right Duplex Findings: +----------+--------+-----+--------+--------+--------+  PSV cm/sRatioStenosisWaveformComments +----------+--------+-----+--------+--------+--------+ EIA Mid 188   biphasicstent  +----------+--------+-----+--------+--------+--------+ EIA [id number]   biphasicstent  +----------+--------+-----+--------+--------+--------+ CFA Mid 333   biphasic  +----------+--------+-----+--------+--------+--------+ ATA Distal104   biphasic  +----------+--------+-----+--------+--------+--------+ PTA Distal141   biphasic  +----------+--------+-----+--------+--------+--------+   Right Stent(s): +---+--------+--------+--------+--------+ EIAPSV cm/sStenosisWaveformComments +---+--------+--------+--------+--------+   Right Graft #1: fem-pop bypass graft +------------------+--------+--------+----------+--------+  PSV cm/sStenosisWaveform Comments +------------------+--------+--------+----------+--------+ Inflow 198  biphasic    +------------------+--------+--------+----------+--------+ Prox Anastomosis  267  biphasic   +------------------+--------+--------+----------+--------+ Proximal Graft 109  biphasic   +------------------+--------+--------+----------+--------+ Mid Graft 87  biphasic   +------------------+--------+--------+----------+--------+ Distal Graft 85  monophasic  +------------------+--------+--------+----------+--------+ Distal Anastomosis149  biphasic   +------------------+--------+--------+----------+--------+ Outflow 144  biphasic   +------------------+--------+--------+----------+--------+   Left Duplex Findings: +----------+--------+-----+--------+--------+--------+  PSV cm/sRatioStenosisWaveformComments +----------+--------+-----+--------+--------+--------+ CFA Distal183   biphasic  +----------+--------+-----+--------+--------+--------+ SFA Prox 0   absent   +----------+--------+-----+--------+--------+--------+   Left Stent(s): +---+--------+--------+--------+--------+ SFAPSV cm/sStenosisWaveformComments +---+--------+--------+--------+--------+ Limited view of left SFA showed an occluded stent      ABI (Date: 08/16/2018):  R:  ? ABI: 0.96 (was 0.93 on 01-31-18),  ? PT: tri ? DP: bi ? TBI:  0.80, toe pressure 146, (was 0.93)  L:  ? ABI: 0.62 (was 0.53),  ? PT: mono ? DP: mono ? TBI: 0.43, toe pressure 78, (was 0.53) ? Right ABI remains normal with tri and biphasic waveforms. ? Slight improvement in left ABI, moderate disease with monophasic waveforms.    PLAN:  Aortogram with runoff possible intervention today to eval possible right EIA and common femoral stenosis  Ruta Hinds,  MD [facility] [place] Office: [phone] Pager: [phone]

## 2018-09-21 NOTE — Progress Notes (Signed)
Site area: left femoral Site Prior to Removal:  Level 0 Pressure Applied For: 15 minutes Manual:   yes Patient Status During Pull:  Stable and tolerated well Post Pull Site:  Level 0 Post Pull Instructions Given:  yes Post Pull Pulses Present: Doppler left DP  Dressing Applied:  Sterile tegaderm and gauze Bedrest begins @ 1250 Comments: manual pressure held by Elza Rafter

## 2018-09-24 ENCOUNTER — Encounter (HOSPITAL_COMMUNITY): Payer: Self-pay | Admitting: Vascular Surgery

## 2018-09-25 ENCOUNTER — Telehealth: Payer: Self-pay | Admitting: Vascular Surgery

## 2018-09-25 NOTE — Telephone Encounter (Signed)
sch appt spk to pt mld ltr 03/21/2019 18ma ABI 12pm RLE 1215 f/u NP

## 2018-09-25 NOTE — Telephone Encounter (Signed)
-----   Message from Elam Dutch, MD sent at 09/21/2018 11:58 AM EST ----- [place] groin Aortogram with bilat runoff No intervention  Had profunda stenosis no external iliac or common femoral as suggested by [place]  Please let Cathy know  Pt needs follow up ABI and right leg bypass duplex in 6 months see in PA/NP clinic  Ruta Hinds

## 2019-03-18 ENCOUNTER — Other Ambulatory Visit: Payer: Self-pay

## 2019-03-18 DIAGNOSIS — I779 Disorder of arteries and arterioles, unspecified: Secondary | ICD-10-CM

## 2019-03-21 ENCOUNTER — Encounter (HOSPITAL_COMMUNITY): Payer: Self-pay

## 2019-03-21 ENCOUNTER — Ambulatory Visit: Payer: Self-pay | Admitting: Family

## 2019-03-21 ENCOUNTER — Other Ambulatory Visit (HOSPITAL_COMMUNITY): Payer: Self-pay

## 2019-05-06 ENCOUNTER — Ambulatory Visit: Payer: Medicaid Other | Admitting: Family

## 2019-05-06 ENCOUNTER — Other Ambulatory Visit (HOSPITAL_COMMUNITY): Payer: Medicaid Other

## 2019-05-06 ENCOUNTER — Encounter (HOSPITAL_COMMUNITY): Payer: Medicaid Other

## 2022-03-02 ENCOUNTER — Other Ambulatory Visit: Payer: Self-pay | Admitting: *Deleted

## 2022-03-02 DIAGNOSIS — I70269 Atherosclerosis of native arteries of extremities with gangrene, unspecified extremity: Secondary | ICD-10-CM

## 2022-03-30 ENCOUNTER — Encounter: Payer: Medicaid Other | Admitting: Vascular Surgery

## 2022-05-11 ENCOUNTER — Encounter: Payer: Self-pay | Admitting: Vascular Surgery

## 2022-05-11 ENCOUNTER — Ambulatory Visit (INDEPENDENT_AMBULATORY_CARE_PROVIDER_SITE_OTHER): Payer: Medicaid Other | Admitting: Vascular Surgery

## 2022-05-11 ENCOUNTER — Ambulatory Visit (INDEPENDENT_AMBULATORY_CARE_PROVIDER_SITE_OTHER): Payer: Medicaid Other

## 2022-05-11 VITALS — BP 154/67 | HR 70 | Temp 97.3°F | Ht 61.0 in | Wt 192.8 lb

## 2022-05-11 DIAGNOSIS — I70269 Atherosclerosis of native arteries of extremities with gangrene, unspecified extremity: Secondary | ICD-10-CM

## 2022-05-11 DIAGNOSIS — I739 Peripheral vascular disease, unspecified: Secondary | ICD-10-CM

## 2022-05-11 NOTE — Progress Notes (Signed)
[facility] [place]  Patient name: Traci Raymond MRN: [id number] DOB: 04-23-1951 Sex: female  REASON FOR CONSULT: Evaluation bilateral lower extremity arterial insufficiency  HPI: Traci Raymond is a 71 y.o. female, who is here today for evaluation of lower extremity arterial insufficiency.  She has a long past history.  She initially presented in 2017 with gangrene of her right second toe.  She underwent arteriography and then right femoral to popliteal bypass with Gore-Tex graft.  She had an adequate saphenous vein in her right and left leg.  Groin complications but eventually had healing.  She then had arteriogram with our practice on 09/21/2018.  She was found to have a patent femoral-popliteal bypass at that time.  She had chronic occlusion of the left superficial femoral artery stent with reconstitution of the below-knee popliteal.  She presents now for follow-up.  Her main complaint is of feeling of fatigue in her left thigh as if her leg will give way with walking.  She does not have any calf claudication bilaterally.  She does not have any tissue loss.  Past Medical History:  Diagnosis Date   Cancer ([place])    Uterian   Diabetes mellitus without complication ([place])    Hyperlipidemia    Controlled by Rx   Hypertension    Peripheral vascular disease ([place])     Family History  Problem Relation Age of Onset   Diabetes Mother    Hypertension Mother    Hyperlipidemia Mother    Diabetes Father    Hyperlipidemia Father    Hypertension Father     SOCIAL HISTORY: Social History   Socioeconomic History   Marital status: Widowed    Spouse name: Not on file   Number of children: Not on file   Years of education: Not on file   Highest education level: Not on file  Occupational History   Not on file  Tobacco Use   Smoking status: Every Day    Packs/day: 0.50    Types: Cigarettes   Smokeless tobacco: Never    Tobacco comments:    1/2 pk per day.   Vaping Use   Vaping Use: Never used  Substance and Sexual Activity   Alcohol use: No    Alcohol/week: 0.0 standard drinks of alcohol   Drug use: No    Comment: pt denies    Sexual activity: Not on file  Other Topics Concern   Not on file  Social History Narrative   Not on file   Social Determinants of Health   Financial Resource Strain: Not on file  Food Insecurity: Not on file  Transportation Needs: Not on file  Physical Activity: Not on file  Stress: Not on file  Social Connections: Not on file  Intimate Partner Violence: Not on file    Allergies  Allergen Reactions   Lisinopril Other (See Comments)    Confusion    Current Outpatient Medications  Medication Sig Dispense Refill   albuterol (VENTOLIN HFA) 108 (90 Base) MCG/ACT inhaler Inhale into the lungs.     amLODipine (NORVASC) 10 MG tablet Take 10 mg by mouth daily.     aspirin EC 81 MG tablet Take by mouth.     atorvastatin (LIPITOR) 20 MG tablet SMARTSIG:1 Tablet(s) By Mouth Every Evening     brimonidine (ALPHAGAN) 0.2 % ophthalmic solution 1 drop 3 (three) times daily.     cholecalciferol (VITAMIN D3) 25 MCG (1000 UNIT) tablet Take 1,000 Units by mouth  daily.     clopidogrel (PLAVIX) 75 MG tablet Take 75 mg by mouth every morning.      EASY TOUCH PEN NEEDLES 32G X 4 MM MISC 2 (two) times daily. as directed     ferrous sulfate 325 (65 FE) MG tablet Take 325 mg by mouth daily with breakfast.     insulin aspart (NOVOLOG FLEXPEN) 100 UNIT/ML FlexPen Inject 30 Units into the skin daily.      isosorbide dinitrate (ISORDIL) 10 MG tablet Take 10 mg by mouth 2 (two) times daily.     JARDIANCE 10 MG TABS tablet Take 10 mg by mouth daily.     LEVEMIR FLEXTOUCH 100 UNIT/ML Pen Inject 30 Units into the skin at bedtime.      losartan (COZAAR) 100 MG tablet Take 100 mg by mouth daily.     metFORMIN (GLUCOPHAGE) 500 MG tablet SMARTSIG:1 Tablet(s) By Mouth Every Evening     nitroGLYCERIN  (NITROSTAT) 0.4 MG SL tablet Place under the tongue.     Potassium Chloride ER 20 MEQ TBCR Take 20 mEq by mouth daily.   11   sitaGLIPtin (JANUVIA) 100 MG tablet Take 100 mg by mouth daily. (Patient not taking: Reported on 05/11/2022)     No current facility-administered medications for this visit.    REVIEW OF SYSTEMS:  '[X]'$  denotes positive finding, '[ ]'$  denotes negative finding Cardiac  Comments:  Chest pain or chest pressure:    Shortness of breath upon exertion:    Short of breath when lying flat:    Irregular heart rhythm:        Vascular    Pain in calf, thigh, or hip brought on by ambulation: x   Pain in feet at night that wakes you up from your sleep:  x   Blood clot in your veins:    Leg swelling:  x       Pulmonary    Oxygen at home: x   Productive cough:     Wheezing:         Neurologic    Sudden weakness in arms or legs:  x   Sudden numbness in arms or legs:     Sudden onset of difficulty speaking or slurred speech:    Temporary loss of vision in one eye:     Problems with dizziness:         Gastrointestinal    Blood in stool:     Vomited blood:         Genitourinary    Burning when urinating:     Blood in urine:        Psychiatric    Major depression:         Hematologic    Bleeding problems:    Problems with blood clotting too easily:        Skin    Rashes or ulcers:        Constitutional    Fever or chills:      PHYSICAL EXAM: Vitals:   05/11/22 1027  BP: (!) 154/67  Pulse: 70  Temp: (!) 97.3 F (36.3 C)  SpO2: 96%  Weight: 192 lb 12.8 oz (87.5 kg)  Height: '5\' 1"'$  (1.549 m)    GENERAL: The patient is a well-nourished female, in no acute distress. The vital signs are documented above. CARDIOVASCULAR: Radial pulses bilaterally.  2+ femoral pulses bilaterally.  I do not palpate popliteal or distal pulses. PULMONARY: There is good air exchange  MUSCULOSKELETAL: There are no  major deformities or cyanosis. NEUROLOGIC: No focal weakness or  paresthesias are detected. SKIN: There are no ulcers or rashes noted. PSYCHIATRIC: The patient has a normal affect.  DATA:  Noninvasive studies today reveal ankle arm index of 0.67 on the right and 0.55 on the left with biphasic signals bilaterally  MEDICAL ISSUES: Had a long discussion with patient regarding her arterial flow.  She does have a normal right femoral pulse.  I did explain that it would be unlikely that arterial insufficiency is causing her right thigh symptoms.  It is possible that she has a stenosis causing this but unlikely.  Explained that she would require arteriography for further evaluation of this.  I did explain that she is not at risk for limb loss.  She did present limb threatening ischemia in 2017 and was adequately treated for this.  Do not palpate a right popliteal pulse.  I did explain that she may have occluded her femoral-popliteal bypass but is not having any calf claudication symptoms so would not pursue this unless she developed tissue loss or calf claudication.  She was reassured with this discussion and will see [place] again on an as-needed basis   Rosetta Posner, MD [facility] [phone] Pager [phone]  Note: Portions of this report may have been transcribed using voice recognition software.  Every effort has been made to ensure accuracy; however, inadvertent computerized transcription errors may still be present.

## 2022-08-11 ENCOUNTER — Telehealth: Payer: Self-pay | Admitting: "Endocrinology

## 2022-08-11 NOTE — Telephone Encounter (Signed)
Received referral on pt. Patient has not responded. Referral closed
